# Patient Record
Sex: Female | Born: 1950 | Race: Black or African American | Hispanic: No | Smoking: Never smoker
Health system: Southern US, Community
[De-identification: ages and names within clinical notes are randomized; demographics above are authoritative.]

---

## 2020-11-16 ENCOUNTER — Other Ambulatory Visit: Payer: Self-pay

## 2020-11-16 ENCOUNTER — Inpatient Hospital Stay (HOSPITAL_COMMUNITY)
Admission: EM | Admit: 2020-11-16 | Discharge: 2020-11-19 | DRG: 310 | Disposition: A | Discharge status: Home Health | Payer: Medicare HMO | Attending: Family Medicine | Admitting: Family Medicine

## 2020-11-16 ENCOUNTER — Emergency Department (HOSPITAL_COMMUNITY): Payer: Medicare HMO

## 2020-11-16 DIAGNOSIS — N1832 Chronic kidney disease, stage 3b: Secondary | ICD-10-CM | POA: Diagnosis present

## 2020-11-16 DIAGNOSIS — E871 Hypo-osmolality and hyponatremia: Secondary | ICD-10-CM | POA: Diagnosis present

## 2020-11-16 DIAGNOSIS — I428 Other cardiomyopathies: Secondary | ICD-10-CM | POA: Diagnosis present

## 2020-11-16 DIAGNOSIS — E559 Vitamin D deficiency, unspecified: Secondary | ICD-10-CM | POA: Diagnosis present

## 2020-11-16 DIAGNOSIS — I959 Hypotension, unspecified: Secondary | ICD-10-CM | POA: Diagnosis present

## 2020-11-16 DIAGNOSIS — Z20822 Contact with and (suspected) exposure to covid-19: Secondary | ICD-10-CM | POA: Diagnosis present

## 2020-11-16 DIAGNOSIS — I517 Cardiomegaly: Secondary | ICD-10-CM | POA: Diagnosis present

## 2020-11-16 DIAGNOSIS — E875 Hyperkalemia: Secondary | ICD-10-CM | POA: Diagnosis not present

## 2020-11-16 DIAGNOSIS — Z7901 Long term (current) use of anticoagulants: Secondary | ICD-10-CM | POA: Diagnosis not present

## 2020-11-16 DIAGNOSIS — I4891 Unspecified atrial fibrillation: Secondary | ICD-10-CM | POA: Diagnosis not present

## 2020-11-16 DIAGNOSIS — I129 Hypertensive chronic kidney disease with stage 1 through stage 4 chronic kidney disease, or unspecified chronic kidney disease: Secondary | ICD-10-CM | POA: Diagnosis present

## 2020-11-16 DIAGNOSIS — N289 Disorder of kidney and ureter, unspecified: Secondary | ICD-10-CM

## 2020-11-16 DIAGNOSIS — Z79899 Other long term (current) drug therapy: Secondary | ICD-10-CM | POA: Diagnosis not present

## 2020-11-16 DIAGNOSIS — F32A Depression, unspecified: Secondary | ICD-10-CM | POA: Diagnosis present

## 2020-11-16 DIAGNOSIS — F0390 Unspecified dementia without behavioral disturbance: Secondary | ICD-10-CM | POA: Diagnosis present

## 2020-11-16 DIAGNOSIS — Z9119 Patient's noncompliance with other medical treatment and regimen: Secondary | ICD-10-CM | POA: Diagnosis not present

## 2020-11-16 DIAGNOSIS — I4821 Permanent atrial fibrillation: Principal | ICD-10-CM | POA: Diagnosis present

## 2020-11-16 LAB — BASIC METABOLIC PANEL WITH GFR
Anion gap: 10 (ref 5–15)
BUN: 24 mg/dL — ABNORMAL HIGH (ref 8–23)
CO2: 19 mmol/L — ABNORMAL LOW (ref 22–32)
Calcium: 9.2 mg/dL (ref 8.9–10.3)
Chloride: 105 mmol/L (ref 98–111)
Creatinine, Ser: 1.81 mg/dL — ABNORMAL HIGH (ref 0.44–1.00)
GFR, Estimated: 30 mL/min — ABNORMAL LOW
Glucose, Bld: 107 mg/dL — ABNORMAL HIGH (ref 70–99)
Potassium: 5.7 mmol/L — ABNORMAL HIGH (ref 3.5–5.1)
Sodium: 134 mmol/L — ABNORMAL LOW (ref 135–145)

## 2020-11-16 LAB — CBC
HCT: 38 % (ref 36.0–46.0)
Hemoglobin: 12.7 g/dL (ref 12.0–15.0)
MCH: 38.6 pg — ABNORMAL HIGH (ref 26.0–34.0)
MCHC: 33.4 g/dL (ref 30.0–36.0)
MCV: 115.5 fL — ABNORMAL HIGH (ref 80.0–100.0)
Platelets: 282 10*3/uL (ref 150–400)
RBC: 3.29 MIL/uL — ABNORMAL LOW (ref 3.87–5.11)
RDW: 20.6 % — ABNORMAL HIGH (ref 11.5–15.5)
WBC: 6.1 10*3/uL (ref 4.0–10.5)
nRBC: 1.7 % — ABNORMAL HIGH (ref 0.0–0.2)

## 2020-11-16 LAB — PROTIME-INR
INR: 1.8 — ABNORMAL HIGH (ref 0.8–1.2)
Prothrombin Time: 21.2 seconds — ABNORMAL HIGH (ref 11.4–15.2)

## 2020-11-16 LAB — MAGNESIUM: Magnesium: 2 mg/dL (ref 1.7–2.4)

## 2020-11-16 LAB — TSH: TSH: 4.224 u[IU]/mL (ref 0.350–4.500)

## 2020-11-16 MED ORDER — DILTIAZEM LOAD VIA INFUSION
10.0000 mg | Freq: Once | INTRAVENOUS | Status: AC
  Administered 2020-11-16: 10 mg via INTRAVENOUS
  Filled 2020-11-16: qty 10

## 2020-11-16 MED ORDER — SODIUM ZIRCONIUM CYCLOSILICATE 5 G PO PACK
5.0000 g | PACK | ORAL | Status: AC
  Administered 2020-11-16: 5 g via ORAL
  Filled 2020-11-16: qty 1

## 2020-11-16 MED ORDER — DRONABINOL 2.5 MG PO CAPS
2.5000 mg | ORAL_CAPSULE | Freq: Two times a day (BID) | ORAL | Status: DC
  Administered 2020-11-17 – 2020-11-19 (×4): 2.5 mg via ORAL
  Filled 2020-11-16 (×5): qty 1

## 2020-11-16 MED ORDER — DULOXETINE HCL 30 MG PO CPEP
30.0000 mg | ORAL_CAPSULE | Freq: Every day | ORAL | Status: DC
  Administered 2020-11-17 – 2020-11-18 (×3): 30 mg via ORAL
  Filled 2020-11-16 (×4): qty 1

## 2020-11-16 MED ORDER — VITAMIN D 25 MCG (1000 UNIT) PO TABS
2000.0000 [IU] | ORAL_TABLET | Freq: Every day | ORAL | Status: DC
  Administered 2020-11-17 – 2020-11-19 (×3): 2000 [IU] via ORAL
  Filled 2020-11-16 (×3): qty 2

## 2020-11-16 MED ORDER — HEPARIN (PORCINE) 25000 UT/250ML-% IV SOLN
1200.0000 [IU]/h | INTRAVENOUS | Status: DC
  Administered 2020-11-16: 1200 [IU]/h via INTRAVENOUS
  Filled 2020-11-16: qty 250

## 2020-11-16 MED ORDER — GABAPENTIN 100 MG PO CAPS
100.0000 mg | ORAL_CAPSULE | Freq: Every day | ORAL | Status: DC
  Administered 2020-11-17 – 2020-11-18 (×3): 100 mg via ORAL
  Filled 2020-11-16 (×3): qty 1

## 2020-11-16 MED ORDER — DILTIAZEM HCL-DEXTROSE 125-5 MG/125ML-% IV SOLN (PREMIX)
5.0000 mg/h | INTRAVENOUS | Status: DC
  Administered 2020-11-16 – 2020-11-17 (×2): 5 mg/h via INTRAVENOUS
  Filled 2020-11-16 (×2): qty 125

## 2020-11-16 NOTE — ED Provider Notes (Signed)
Is a 70 year old female presenting with atrial fibrillation with rapid ventricular rate.  The patient is not very clear on her history and is not able to give me a very clear understanding of why she is even here.  According to the notes from the office she was sent for ongoing atrial fibrillation with rapid ventricular rate.  The patient has been treated with anticoagulants and rate control however the patient is very clear in telling us that she does not take her medications regularly and when I asked when she last missed her dose of anticoagulant she states she misses it frequently.  She feels the need to repeatedly tell me that she does not do dope  On exam she is obese, she has a normal respiratory rate it is able to speak in full sentences but has a significant tachycardia with a heart rate of between 150 and 170 bpm and what appears to be a narrow complex irregular tachycardia.  This patient is critically ill with atrial fibrillation and rapid ventricular response, she will need IV Cardizem with a drip and admission to the hospital.  She does not meet the candidate criteria for cardioversion since she is not compliant with her medications and with her altered mental status we cannot be clear about the truth behind her compliance.  There is no family members with her and there is no medical records to review as this is her first time in the system.  Will consult with admitting team, provide rate control and provide IV heparin.  The son has now arrived and states that in February he was contacted by the patient to come down to [facility] where she had been admitted with sepsis, she has chronic atrial fibrillation and after the sepsis was treated he brought the patient back to [facility] to live with him because he was unsure if she was taking her medication, since then he has been taking care of her daily and making sure that she is compliant with her medications.  Unfortunately she was readmitted with  atrial fibrillation with a rapid rate approximately 3 weeks ago at [facility], she was reinitiated on the medications that have been discontinued due to hypotension such as metoprolol, since that time she has done okay but when she went back to the office for a visit today he was instructed to bring her back to the hospital because of atrial fibrillation with a rapid rate, borderline hypotension and the need to see electrophysiology and have other considerations such as ablation or antiarrhythmics.  He has been giving her Eliquis consistently. - no missed doses.  I discussed the care with cardiology, they agree that EP can be consulted in the morning, discussed with family medicine who will admit, patient is critically ill but doing better on Cardizem drip  Medical screening examination/treatment/procedure(s) were conducted as a shared visit with non-physician practitioner(s) and myself.  I personally evaluated the patient during the encounter.  Clinical Impression:   Final diagnoses:  Atrial fibrillation with tachycardic ventricular rate (HCC)  Hyperkalemia  Renal insufficiency    .Critical Care Performed by: Eber Hong, MD Authorized by: Eber Hong, MD   Critical care provider statement:    Critical care time (minutes):  35   Critical care time was exclusive of:  Separately billable procedures and treating other patients and teaching time   Critical care was necessary to treat or prevent imminent or life-threatening deterioration of the following conditions:  Cardiac failure   Critical care was time  spent personally by me on the following activities:  Blood draw for specimens, development of treatment plan with patient or surrogate, discussions with consultants, evaluation of patient's response to treatment, examination of patient, obtaining history from patient or surrogate, ordering and performing treatments and interventions, ordering and review of laboratory studies,  ordering and review of radiographic studies, pulse oximetry, re-evaluation of patient's condition and review of old charts      Eber Hong, MD 11/16/20 2125

## 2020-11-16 NOTE — H&P (Incomplete)
Family Medicine Teaching [facility] Admission History and Physical Service Pager: [phone]  Patient name: Kylie Collins Medical record number: [id number] Date of birth: 03/09/1951 Age: 70 y.o. Gender: female  Primary Care Provider: Pcp, No Consultants: Cardiology Code Status: Full Preferred Emergency Contact: Kylie Collins (son) [phone]  Chief Complaint: atrial fibrillation with RVR  Assessment and Plan: Kylie Collins is a 70 y.o. female presenting with *** . PMH is significant for ***  ***  Atrial fibrillation with RVR CHADSVASc HASBLED*** -Admit to FPTS, attending Dr. Deirdre Priest -cardiology consulted, appreciate recommendations -am EKG -  Hx of Hypertension Hypotension  Hyponatremia  Hyperkalemia  AKI? in the setting of CKD IIIb Cr 1.81 and GFR 30 on admission, unable to determine baseline given no records. -am CMP -avoid nephrotoxic agents    Memory concerns -delirium precautions  Depression  Vitamin D deficiency Home meds include cholecalciferol 2,000 units daily.  -continue home supplementation   FEN/GI: heart healthy  Prophylaxis: heparin   Disposition: admit to FPTS (cardiac telemetry), attending Dr. Deirdre Priest   History of Present Illness:  Kylie Collins is a 70 y.o. female presenting with A fib with RVR. Patient was in her usual state of health and went to cardiologist's office where she was found to be in in A fib with RVR, HR in 150s. Patient recently moved from [place] into home with her son in February as she has dementia and isn't able to live by herself. She does none of her IADLs, she is able to do ADLs **. Patient ambulates with walker at baseline, but needs full assistance with transfers. Patient was seen on 4/7  History predominantly obtained from son.  Review Of Systems: Per HPI with the following additions:   Review of Systems  Constitutional: Negative for activity change, chills and fever.  Cardiovascular: Positive for  palpitations. Negative for chest pain.  Psychiatric/Behavioral: Positive for confusion.  All other systems reviewed and are negative.    Patient Active Problem List   Diagnosis Date Noted  . Atrial fibrillation with rapid ventricular response (HCC) 11/16/2020    Past Medical History: No past medical history on file.  Past Surgical History: *** The histories are not reviewed yet. Please review them in the "History" navigator section and refresh this SmartLink.  Denies h/o surgery  Social History:   Additional social history: patient lives with her son and his family, moved to [place] from [place] in February, 2022.  Please also refer to relevant sections of EMR.  Family History: No family history on file.  Allergies and Medications: No Known Allergies No current facility-administered medications on file prior to encounter.   No current outpatient medications on file prior to encounter.    Objective: BP 102/90   Pulse 85   Temp 97.6 F (36.4 C) (Axillary)   Resp 16   SpO2 96%  Exam: General: *** Eyes: *** ENTM: *** Neck: *** Cardiovascular: *** Respiratory: *** Gastrointestinal: *** MSK: *** Derm: *** Neuro: *** Psych: ***  Labs and Imaging: CBC BMET  Recent Labs  Lab 11/16/20 1709  WBC 6.1  HGB 12.7  HCT 38.0  PLT 282   Recent Labs  Lab 11/16/20 1709  NA 134*  K 5.7*  CL 105  CO2 19*  BUN 24*  CREATININE 1.81*  GLUCOSE 107*  CALCIUM 9.2     EKG: My own interpretation (not copied from electronic read) ***   DG Chest 2 View  Result Date: 11/16/2020 CLINICAL DATA:  Atrial fibrillation. EXAM: CHEST - 2 VIEW  COMPARISON:  None. FINDINGS: Mild cardiomegaly. Normal mediastinal contours. No pulmonary edema. No focal airspace disease, large pleural effusion or pneumothorax. Multilevel degenerative change in the thoracic spine. Superior subluxation of the humeral head abutting the undersurface of the acromion on the right. No acute osseous abnormalities  are seen. IMPRESSION: Mild cardiomegaly without acute pulmonary process. Electronically Signed   By: Narda Rutherford M.D.   On: 11/16/2020 17:56    Reece Leader, DO 11/16/2020, 9:25 PM PGY-1, [facility] Family Medicine FPTS Intern pager: [phone], text pages welcome

## 2020-11-16 NOTE — ED Triage Notes (Signed)
Pt bib [facility] from [facility] cardiologist office. Pt in afib w/RVR at office, EMS was called. Pt's HR =150s-200s.  Pt states she feels fine, denies pain, weakness or shortness of breath on arrival.   EMS VS: BP=100/62 HR=150s-200s

## 2020-11-16 NOTE — ED Provider Notes (Signed)
[facility] EMERGENCY DEPARTMENT Provider Note   CSN: [id number] Arrival date & time: 11/16/20  1651     History Chief Complaint  Patient presents with  . afib w/RVR    Kylie Collins is a 70 y.o. female.  HPI   Patient presents via EMS to ED with AF with RVR from the office of Dr. Houston Siren. Patient has a history of persistent atrial fibrillation. She was hospitalized two weeks ago for this condition, but patient is unable to recall any medication changes or treatment plans. She has been taking elliquis for one year, but reports medical noncompliance. She states she has missed "more than one dose" in the past. Her son states that she has been living with him for the past two months and he has been distributing her medications. She has been off and on her metoprolol due to sporadic hypotension.   She reports feeling weak, but denies any chest pain, SOB, leg welling, or syncope.   No past medical history on file.  There are no problems to display for this patient.     OB History   No obstetric history on file.     No family history on file.     Home Medications Prior to Admission medications   Not on File    Allergies    Patient has no known allergies.  Review of Systems    All other systems were reviewed and are negative except for those noted in the HPI.  Physical Exam Updated Vital Signs BP 107/84   Pulse 98   Temp 97.6 F (36.4 C) (Axillary)   Resp 17   SpO2 95%   Physical Exam Vitals and nursing note reviewed. Exam conducted with a chaperone present.  Constitutional:      Appearance: Normal appearance.  HENT:     Head: Normocephalic and atraumatic.  Eyes:     General: No scleral icterus.       Right eye: No discharge.        Left eye: No discharge.     Extraocular Movements: Extraocular movements intact.     Pupils: Pupils are equal, round, and reactive to light.  Cardiovascular:     Rate and Rhythm: Tachycardia present.  Rhythm irregular.     Pulses: Normal pulses.     Heart sounds: Normal heart sounds. No murmur heard. No friction rub. No gallop.   Pulmonary:     Effort: Pulmonary effort is normal. No respiratory distress.     Breath sounds: Normal breath sounds.  Abdominal:     General: Abdomen is flat. Bowel sounds are normal. There is no distension.     Palpations: Abdomen is soft.     Tenderness: There is no abdominal tenderness.  Skin:    General: Skin is warm and dry.     Coloration: Skin is not jaundiced.  Neurological:     Mental Status: She is alert. Mental status is at baseline.     Coordination: Coordination normal.     ED Results / Procedures / Treatments   Labs (all labs ordered are listed, but only abnormal results are displayed) Labs Reviewed  BASIC METABOLIC PANEL - Abnormal; Notable for the following components:      Result Value   Sodium 134 (*)    Potassium 5.7 (*)    CO2 19 (*)    Glucose, Bld 107 (*)    BUN 24 (*)    Creatinine, Ser 1.81 (*)    GFR, Estimated 30 (*)  All other components within normal limits  CBC - Abnormal; Notable for the following components:   RBC 3.29 (*)    MCV 115.5 (*)    MCH 38.6 (*)    RDW 20.6 (*)    nRBC 1.7 (*)    All other components within normal limits  PROTIME-INR - Abnormal; Notable for the following components:   Prothrombin Time 21.2 (*)    INR 1.8 (*)    All other components within normal limits  SARS CORONAVIRUS 2 (TAT 6-24 HRS)  MAGNESIUM  TSH  RAPID URINE DRUG SCREEN, HOSP PERFORMED  HEPARIN LEVEL (UNFRACTIONATED)  APTT    EKG EKG Interpretation  Date/Time:  Wednesday November 16 2020 16:52:34 EDT Ventricular Rate:  166 PR Interval:    QRS Duration: 86 QT Interval:  256 QTC Calculation: 426 R Axis:   -70 Text Interpretation: Atrial fibrillation with rapid V-rate Left anterior fascicular block Low voltage, precordial leads Repolarization abnormality, prob rate related no old EKG Confirmed by Eber Hong  [phone]) on 11/16/2020 5:31:59 PM   Radiology DG Chest 2 View  Result Date: 11/16/2020 CLINICAL DATA:  Atrial fibrillation. EXAM: CHEST - 2 VIEW COMPARISON:  None. FINDINGS: Mild cardiomegaly. Normal mediastinal contours. No pulmonary edema. No focal airspace disease, large pleural effusion or pneumothorax. Multilevel degenerative change in the thoracic spine. Superior subluxation of the humeral head abutting the undersurface of the acromion on the right. No acute osseous abnormalities are seen. IMPRESSION: Mild cardiomegaly without acute pulmonary process. Electronically Signed   By: Narda Rutherford M.D.   On: 11/16/2020 17:56    Procedures Procedures   Medications Ordered in ED Medications  diltiazem (CARDIZEM) 1 mg/mL load via infusion 10 mg (10 mg Intravenous Bolus from Bag 11/16/20 1810)    And  diltiazem (CARDIZEM) 125 mg in dextrose 5% 125 mL (1 mg/mL) infusion (5 mg/hr Intravenous New Bag/Given 11/16/20 1805)  heparin ADULT infusion 100 units/mL (25000 units/28mL) (1,200 Units/hr Intravenous New Bag/Given 11/16/20 1833)  sodium zirconium cyclosilicate (LOKELMA) packet 5 g (has no administration in time range)    ED Course  I have reviewed the triage vital signs and the nursing notes.  Pertinent labs & imaging results that were available during my care of the patient were reviewed by me and considered in my medical decision making (see chart for details).    MDM Rules/Calculators/A&P                          Patient is a 70 year old patient brought to ED by EMS for AF with AVR. HR tachycardic to 160s. Not hypoxic. Given heparin and Cardizem. Consulted cardiology and decided to admit to medicine with EP consult tomorrow.   Final Clinical Impression(s) / ED Diagnoses Final diagnoses:  None    Rx / DC Orders ED Discharge Orders         Ordered    Amb referral to AFIB Clinic        11/16/20 1709           Theron Arista, Cordelia Poche 11/16/20 1956    Eber Hong,  MD 11/26/20 [phone]

## 2020-11-16 NOTE — Progress Notes (Signed)
ANTICOAGULATION CONSULT NOTE - Initial Consult  Pharmacy Consult for heparin dosing. Indication: atrial fibrillation  No Known Allergies  Patient Measurements:   Heparin Dosing Weight: (unknown, seen note below)   Vital Signs: Temp: 97.6 F (36.4 C) (04/27 1652) Temp Source: Axillary (04/27 1652) BP: 110/79 (04/27 1652) Pulse Rate: 37 (04/27 1652)  Labs: No results for input(s): HGB, HCT, PLT, APTT, LABPROT, INR, HEPARINUNFRC, HEPRLOWMOCWT, CREATININE, CKTOTAL, CKMB, TROPONINIHS in the last 72 hours.  CrCl cannot be calculated (No successful lab value found.).   Medical History: No past medical history on file.   Assessment: 70 y.o. female presenting with Afib in RVR. Pharmacy has been consulted IV heparin for afib. Patient reportedly on Eliquis PTA for afib but has been noted as non-compliant with her medications. Her current AMS, lack of EMR charts to review, and no family present limits ability to clarify if she has taken her Eliquis recently. Attempted to call family at number of file but is apparently not connected to the patient or relative. Patient states she is unable to walk, and when asked about her weight stated "100-something...200-something". MD notes suggestive that patient most likely has not taken Eliquis it today but will avoid heparin bolus and initiate infusion at a lower rate given uncertainty.   Goal of Therapy:  Heparin level 0.3-0.7 units/ml Monitor platelets by anticoagulation protocol: Yes   Plan:  Start heparin infusion at 1200 units/hr Check anti-Xa level in 6 hours and daily while on heparin Check baseline aPTT due to possible Eliquis use Continue to monitor H&H and platelets  Trixie Rude, PharmD PGY1 Acute Care Pharmacy Resident 11/16/2020 5:57 PM  Please check [email or web address] for unit-specific pharmacy phone numbers.

## 2020-11-16 NOTE — H&P (Addendum)
Family Medicine Teaching [facility] Admission History and Physical Service Pager: [phone]  Patient name: Kylie Collins Medical record number: [id number] Date of birth: [birth year] Age: 70 y.o. Gender: female  Primary Care Provider: Pcp, No Consultants: Cardiology Code Status: Full Preferred Emergency Contact: Leah Thornberry (son) [phone]  Chief Complaint: atrial fibrillation with RVR  Assessment and Plan: Kylie Collins is a 70 y.o. female presenting with atrial fibrillation with RVR. PMH is significant for atrial fibrillation, CKD, depression and vitamin D deficiency.   Atrial fibrillation with RVR Patient presents after having regular outpatient cardiology follow up earlier today where she was noted to be in RVR in 150-160 range with known history of atrial fibrillation. She presented tachycardic to 160s and tachypneic to 30s while normotensive. Remained asymptomatic as she did not experience chest pain, dyspnea and LE edema. She has not required supplemental oxygen. Home medications include metoprolol tartrate 25 mg daily and eliquis 5 mg bid. No echo per chart review as patient recently moved from [place] to live with her son. Labs notable for Na 134, K 5.7, Cr 1.81 and TSH wnl. CXR demonstrated mild cardiomegaly without acute pulmonary process. In the ED, given diltiazem bolus and placed on diltiazem drip, HR improved to 100-110 range. On physical exam, patient no longer tachycardic but noted to have irregularly irregular rhythm consistent with atrial fibrillation. Appears euvolemic on exam. CHADSVASC score of 3 and HASBLED score of 1. Will admit for management of rate control, cardiology consulted. Cardiology recommends EP assessment tomorrow. -Admit to FPTS, attending Dr. Deirdre Priest -cardiology consulted, appreciate recommendations -hold home meds  -s/p diltiazem bolus  - continue IV diltiazem gtt -heparin per pharmacy  -am EKG -f/u am CBC, CMP, Mg, Phos -awaiting  echo -monitor HR -heart healthy diet  -up with assistance -PT/OT eval and treat -cardiology outpatient follow up following discharge  -consult EP tomorrow  Hypotension  Hx of Hypertension  BP on admission 110/79. Concerns of hypotension per son although patient noted to have history of hypertension. Home meds include metoprolol tartrate for atrial fibrillation as above but not taking any anti-hypertensives. -monitor BP -awaiting orthostatic vitals   Electrolyte derangements Admitted with hyperkalemia of 5.7 and mild hyponatremia of 134.  - hold home potassium replacement -s/p lokelma 5 g -am CMP  AKI? in the setting of CKD IIIb Cr 1.81 and GFR 30 on admission, unable to determine baseline given no records. -am CMP to monitor renal function -avoid nephrotoxic agents    Memory concerns Patient is currently AOx1, son reports that this is baseline as she waxes and wanes often. Given that patient's son has just recently taken on role of caregiver, he is unaware of if patient has a documented diagnosis of dementia although this is likely given the progressive state, memory impairment, ADL and IADL impairment, and executive dysfunction in >2 domains. -delirium precautions -consider neurology outpatient for formal evaluation   Depression Home meds include duloxetine 30 mg daily. -continue home duloxetine   Vitamin D deficiency Home meds include cholecalciferol 2,000 units daily.  -continue home supplementation   FEN/GI: heart healthy  Prophylaxis: heparin   Disposition: admit to FPTS (cardiac telemetry), attending Dr. Deirdre Priest   History of Present Illness:  Kylie Collins is a 70 y.o. female presenting with A fib with RVR. Patient was in her usual state of health and went to cardiologist's office where she was found to be in in A fib with RVR, HR in 150s. Son reports that patient was and has remained asymptomatic. Patient recently  moved from [place] into home with her son in  February as she has dementia and isn't able to live by herself. She does none of her IADLs, she is able to feed herself. Patient utilizes wheelchair assistance over the pat 7-8 months but ambulated with walker prior to this.  Patient requires full assistance for transfers. She was previously seen at [facility] in [place] for same concern. Home meds include metoprolol which she has been intermittently on and off of it given concerns for hypotension along with eliquis. Patient has no history of prior strokes, per son.   History predominantly obtained from son.  Review Of Systems: Per HPI with the following additions:   Review of Systems  Constitutional: Negative for activity change, chills and fever.  Respiratory: Negative for cough and shortness of breath.   Cardiovascular: Negative for chest pain, palpitations and leg swelling.  Gastrointestinal: Negative for nausea and vomiting.  Neurological: Negative for headaches.  Psychiatric/Behavioral: Positive for confusion.  All other systems reviewed and are negative.    Patient Active Problem List   Diagnosis Date Noted  . Atrial fibrillation with rapid ventricular response (HCC) 11/16/2020    Past Medical History: No past medical history on file.  Past Surgical History: Denies past surgical history.   Social History:   Additional social history: patient lives with her son and his family, moved to [place] from [place] in February, 2022.  Please also refer to relevant sections of EMR.  Family History: No family history on file.  Allergies and Medications: No Known Allergies No current facility-administered medications on file prior to encounter.   No current outpatient medications on file prior to encounter.    Objective: BP 102/90   Pulse 85   Temp 97.6 F (36.4 C) (Axillary)   Resp 16   SpO2 96%  Exam: General: age appropriate AAW, Patient sitting upright in bed, in no acute distress, obese Neck: supple, no evidence of  lymphadenopathy, non-tender thyroid  Cardiovascular: irregularly irregular rhythm, no murmurs or gallops auscultated  Respiratory: CTAB, no rales or rhonchi noted Gastrointestinal: soft, nontender, BS+ Ext: radial and distal pulses strong and equal bilaterally, no LE edema noted bilaterally, hyperpigmentation mid tiba to ankle appearing chronic, no calf tenderness noted bilaterally  Derm: skin warm and dry to touch, no rashes or lesions noted  Neuro: AOx1 (thinks we are in [place] and its 1997), follows all commands Psych: mood appropriate, pleasant, no agitation noted   Labs and Imaging: CBC BMET  Recent Labs  Lab 11/16/20 1709  WBC 6.1  HGB 12.7  HCT 38.0  PLT 282   Recent Labs  Lab 11/16/20 1709  NA 134*  K 5.7*  CL 105  CO2 19*  BUN 24*  CREATININE 1.81*  GLUCOSE 107*  CALCIUM 9.2     EKG: HR 100, indiscernible p waves consistent with atrial fibrillation, no notable ST elevations, peaked T waves     DG Chest 2 View  Result Date: 11/16/2020 CLINICAL DATA:  Atrial fibrillation. EXAM: CHEST - 2 VIEW COMPARISON:  None. FINDINGS: Mild cardiomegaly. Normal mediastinal contours. No pulmonary edema. No focal airspace disease, large pleural effusion or pneumothorax. Multilevel degenerative change in the thoracic spine. Superior subluxation of the humeral head abutting the undersurface of the acromion on the right. No acute osseous abnormalities are seen. IMPRESSION: Mild cardiomegaly without acute pulmonary process. Electronically Signed   By: Narda Rutherford M.D.   On: 11/16/2020 17:56    Reece Leader, DO 11/16/2020, 9:25 PM PGY-1, [facility] FPTS Intern pager: [phone], text pages welcome  FPTS Upper-Level Resident Addendum   I have independently interviewed and examined the patient. I have discussed the above with the original author and agree with their documentation. My edits for correction/addition/clarification are in pink. Please see also any attending  notes.   Shirlean Mylar, M.D. PGY-2, [facility] 11/17/2020 4:58 AM  FPTS Service pager: [phone] (text pages welcome through [facility])

## 2020-11-17 ENCOUNTER — Encounter (HOSPITAL_COMMUNITY): Payer: Self-pay | Admitting: Family Medicine

## 2020-11-17 ENCOUNTER — Other Ambulatory Visit: Payer: Self-pay

## 2020-11-17 ENCOUNTER — Other Ambulatory Visit (HOSPITAL_COMMUNITY): Payer: Medicare HMO

## 2020-11-17 DIAGNOSIS — I4891 Unspecified atrial fibrillation: Secondary | ICD-10-CM

## 2020-11-17 LAB — CBC
HCT: 36.3 % (ref 36.0–46.0)
Hemoglobin: 12.1 g/dL (ref 12.0–15.0)
MCH: 38.4 pg — ABNORMAL HIGH (ref 26.0–34.0)
MCHC: 33.3 g/dL (ref 30.0–36.0)
MCV: 115.2 fL — ABNORMAL HIGH (ref 80.0–100.0)
Platelets: 260 10*3/uL (ref 150–400)
RBC: 3.15 MIL/uL — ABNORMAL LOW (ref 3.87–5.11)
RDW: 20.5 % — ABNORMAL HIGH (ref 11.5–15.5)
WBC: 5.6 10*3/uL (ref 4.0–10.5)
nRBC: 1.1 % — ABNORMAL HIGH (ref 0.0–0.2)

## 2020-11-17 LAB — HEMOGLOBIN A1C
Hgb A1c MFr Bld: 4.6 % — ABNORMAL LOW (ref 4.8–5.6)
Mean Plasma Glucose: 85.32 mg/dL

## 2020-11-17 LAB — HEPARIN LEVEL (UNFRACTIONATED)
Heparin Unfractionated: >1.1 IU/mL — ABNORMAL HIGH (ref 0.30–0.70)
Heparin Unfractionated: >1.1 IU/mL — ABNORMAL HIGH (ref 0.30–0.70)
Heparin Unfractionated: >1.1 IU/mL — ABNORMAL HIGH (ref 0.30–0.70)

## 2020-11-17 LAB — COMPREHENSIVE METABOLIC PANEL
ALT: 15 U/L (ref 0–44)
AST: 17 U/L (ref 15–41)
Albumin: 2.7 g/dL — ABNORMAL LOW (ref 3.5–5.0)
Alkaline Phosphatase: 90 U/L (ref 38–126)
Anion gap: 11 (ref 5–15)
BUN: 24 mg/dL — ABNORMAL HIGH (ref 8–23)
CO2: 18 mmol/L — ABNORMAL LOW (ref 22–32)
Calcium: 9.4 mg/dL (ref 8.9–10.3)
Chloride: 104 mmol/L (ref 98–111)
Creatinine, Ser: 1.97 mg/dL — ABNORMAL HIGH (ref 0.44–1.00)
GFR, Estimated: 27 mL/min — ABNORMAL LOW (ref >60)
Glucose, Bld: 83 mg/dL (ref 70–99)
Potassium: 5.5 mmol/L — ABNORMAL HIGH (ref 3.5–5.1)
Sodium: 133 mmol/L — ABNORMAL LOW (ref 135–145)
Total Bilirubin: 1.2 mg/dL (ref 0.3–1.2)
Total Protein: 6.1 g/dL — ABNORMAL LOW (ref 6.5–8.1)

## 2020-11-17 LAB — BASIC METABOLIC PANEL
Anion gap: 9 (ref 5–15)
BUN: 25 mg/dL — ABNORMAL HIGH (ref 8–23)
CO2: 20 mmol/L — ABNORMAL LOW (ref 22–32)
Calcium: 9.1 mg/dL (ref 8.9–10.3)
Chloride: 104 mmol/L (ref 98–111)
Creatinine, Ser: 1.97 mg/dL — ABNORMAL HIGH (ref 0.44–1.00)
GFR, Estimated: 27 mL/min — ABNORMAL LOW (ref >60)
Glucose, Bld: 86 mg/dL (ref 70–99)
Potassium: 4.9 mmol/L (ref 3.5–5.1)
Sodium: 133 mmol/L — ABNORMAL LOW (ref 135–145)

## 2020-11-17 LAB — APTT
aPTT: >200 seconds (ref 24–36)
aPTT: >200 seconds (ref 24–36)
aPTT: >200 seconds (ref 24–36)

## 2020-11-17 LAB — SARS CORONAVIRUS 2 (TAT 6-24 HRS): SARS Coronavirus 2: NEGATIVE

## 2020-11-17 LAB — PHOSPHORUS: Phosphorus: 2.9 mg/dL (ref 2.5–4.6)

## 2020-11-17 LAB — MAGNESIUM: Magnesium: 1.9 mg/dL (ref 1.7–2.4)

## 2020-11-17 MED ORDER — DILTIAZEM HCL ER COATED BEADS 180 MG PO CP24
180.0000 mg | ORAL_CAPSULE | Freq: Every day | ORAL | Status: DC
  Administered 2020-11-17: 180 mg via ORAL
  Filled 2020-11-17 (×2): qty 1

## 2020-11-17 MED ORDER — SODIUM ZIRCONIUM CYCLOSILICATE 5 G PO PACK
5.0000 g | PACK | ORAL | Status: AC
  Administered 2020-11-17: 5 g via ORAL
  Filled 2020-11-17: qty 1

## 2020-11-17 MED ORDER — HEPARIN (PORCINE) 25000 UT/250ML-% IV SOLN
850.0000 [IU]/h | INTRAVENOUS | Status: DC
  Filled 2020-11-17: qty 250

## 2020-11-17 MED ORDER — HEPARIN (PORCINE) 25000 UT/250ML-% IV SOLN
900.0000 [IU]/h | INTRAVENOUS | Status: DC
  Administered 2020-11-17: 900 [IU]/h via INTRAVENOUS

## 2020-11-17 NOTE — Progress Notes (Signed)
ANTICOAGULATION CONSULT NOTE - Follow Up Consult  Pharmacy Consult for heparin Indication: atrial fibrillation  No Known Allergies  Patient Measurements: Height: 5\' 7"  (170.2 cm) Weight: 113.4 kg (250 lb) IBW/kg (Calculated) : 61.6 Heparin Dosing Weight: 87.9 kg  Vital Signs: Temp: 97.5 F (36.4 C) (04/28 1347) Temp Source: Axillary (04/28 1347) BP: 112/76 (04/28 1347) Pulse Rate: 101 (04/28 1347)  Labs: Recent Labs    11/16/20 1709 11/17/20 0142 11/17/20 0143 11/17/20 0203 11/17/20 0428  HGB 12.7  --   --  12.1  --   HCT 38.0  --   --  36.3  --   PLT 282  --   --  260  --   APTT  --  >200*  --   --  >200*  LABPROT 21.2*  --   --   --   --   INR 1.8*  --   --   --   --   HEPARINUNFRC  --   --  >1.10*  --  >1.10*  CREATININE 1.81*  --   --  1.97*  --     Estimated Creatinine Clearance: 34 mL/min (A) (by C-G formula based on SCr of 1.97 mg/dL (H)).   Medications:  Scheduled:  . cholecalciferol  2,000 Units Oral Daily  . dronabinol  2.5 mg Oral BID WC  . DULoxetine  30 mg Oral QHS  . gabapentin  100 mg Oral QHS    Assessment: 70 yo female presenting with Afib in RVR.  Patient is on Eliquis PTA and her son reports compliance with medication- states she lives with him and he watches her take it twice daily.  Last PTA dose appears to be 4/27 AM.  Pharmacy consulted to dose IV heparin.  Awaiting EP rec's before transitioning back to PO anticoagulation.   aPTT consistently elevated at >200.  Confirmed with RN that level drawn from R arm, heparin running through L arm.  No bleeding reported.  CBC stable.  HL supratherapeutic at >1.1 due to recent Eliquis administration.  Continue to trend both HL and aPTT until they are correlating as likely still seeing the effects of Eliquis.    Goal of Therapy:  Heparin level 0.3-0.7 units/ml  APTT 66-102 seconds Monitor platelets by anticoagulation protocol: Yes   Plan:  Hold heparin infusion x 1 hour Restart heparin drip at  700 units/hr at 1800 (6pm) F/u HL and aPTT in 8 hours  Monitor for s/sx bleedng F/u ability to switch to Eliquis  5/27, PharmD PGY-1 Acute Care Pharmacy Resident Office: [phone] 11/17/2020 3:04 PM

## 2020-11-17 NOTE — Consult Note (Addendum)
Cardiology Consultation:   Patient ID: Kylie Collins MRN: [id number]; DOB: 04/20/1951  Admit date: 11/16/2020 Date of Consult: 11/17/2020  PCP:  Kylie Collins   [facility]  Cardiologist:  none [id number]    Patient Profile:   Kylie Collins is a 70 y.o. female with unknown PMHx, H&P reports AFib hx and HTN, Vit D deficiency, CKD, depression, and home meds of metoprolol tartrate 25 mg daily and eliquis 5 mg bid, this apparently obtained via the patient's son. who is being seen today for the evaluation of  at the request of Dr. Deirdre Collins.  History of Present Illness:   PMHx and HPI is obtained entirely from the chart The patient Kylie Collins open her eyes and follow commands though only after much prompting, tells me she is tired. Doesn't know why she is her or how she got here, initially told m she was at home H&P report AAOx1, this is the same now. Son apparently gave this as baseline as well that waxes and wanes.  There is no contact in her chart for her son  Kylie Collins in review was reportedly at a cardiology office found in RVR and apparently referred to the ER, admitted yesterday, started on dilt for rate control and heparin gtt,  I do not see any cardiology charting though apparently recommendation was to consult EP in the morning  Rates are better on the diltiazem gtt though to the low 100's only on 5mg /hr gtt  LABS K+ 5.7 > 5.5 BUN/Creat 24/1.97 Mag 2.0, 1.9 WBC 5.6 H/H 12/36 Plts 260  echo ordered, not yet completed      Inpatient Medications: Scheduled Meds:  cholecalciferol  2,000 Units Oral Daily   dronabinol  2.5 mg Oral BID WC   DULoxetine  30 mg Oral QHS   gabapentin  100 mg Oral QHS   Continuous Infusions:  diltiazem (CARDIZEM) infusion 5 mg/hr (11/17/20 1339)   heparin 900 Units/hr (11/17/20 0653)   PRN Meds:   Allergies:   No Known Allergies  Social History:   Social History   Socioeconomic History   Marital status:  Unknown    Spouse name: Not on file   Number of children: Not on file   Years of education: Not on file   Highest education level: Not on file  Occupational History   Not on file  Tobacco Use   Smoking status: Not on file   Smokeless tobacco: Not on file  Substance and Sexual Activity   Alcohol use: Not on file   Drug use: Not on file   Sexual activity: Not on file  Other Topics Concern   Not on file  Social History Narrative   Not on file   Social Determinants of Health   Financial Resource Strain: Not on file  Food Insecurity: Not on file  Transportation Needs: Not on file  Physical Activity: Not on file  Stress: Not on file  Social Connections: Not on file  Intimate Partner Violence: Not on file    Family History:   Unable to obtain, pt AAO x1 only, no family available  ROS:  Please see the history of present illness.  All other ROS reviewed and negative.     Physical Exam/Data:   Vitals:   11/17/20 1326 11/17/20 1346 11/17/20 1347 11/17/20 1512  BP:   112/76 107/77  Pulse:  98 (!) 101 96  Resp:  12 16 14   Temp:   (!) 97.5 F (36.4 C) (!) 96.7 F (35.9  C)  TempSrc:   Axillary Axillary  SpO2:  96% 96%   Weight: 113.4 kg     Height: 5\' 7"  (1.702 m)      No intake or output data in the 24 hours ending 11/17/20 1538 Last 3 Weights 11/17/2020  Weight (lbs) 250 lb  Weight (kg) 113.399 kg     Body mass index is 39.16 kg/m.  General:  Well nourished, well developed, in no acute distress, sleeping but wakes with verbal HEENT: normal Lymph: no adenopathy Neck: no JVD Endocrine:  No thryomegaly Vascular: No carotid bruits Cardiac:  irreg-irreg; no murmurs, gallops or rubs Lungs:  CA b/l, no wheezing, rhonchi or rales  Abd: soft, nontende  Ext: no edema Musculoskeletal:  No deformities Skin: warm and dry  Neuro:  AAO x1, follows direction with much prompting, no gross focal abnormalities noted Psych:  Normal affect   EKG:  The EKG was personally reviewed  and demonstrates:   AFib 166bpm Afib 101bpm, lat T inv  No historical EKGs  Telemetry:  Telemetry was personally reviewed and demonstrates:   AFib 100's-110s  Relevant CV Studies:  Echo is ordered and pending  Laboratory Data:  High Sensitivity Troponin:  No results for input(s): TROPONINIHS in the last 720 hours.   Chemistry Recent Labs  Lab 11/16/20 1709 11/17/20 0203  NA 134* 133*  K 5.7* 5.5*  CL 105 104  CO2 19* 18*  GLUCOSE 107* 83  BUN 24* 24*  CREATININE 1.81* 1.97*  CALCIUM 9.2 9.4  GFRNONAA 30* 27*  ANIONGAP 10 11    Recent Labs  Lab 11/17/20 0203  PROT 6.1*  ALBUMIN 2.7*  AST 17  ALT 15  ALKPHOS 90  BILITOT 1.2   Hematology Recent Labs  Lab 11/16/20 1709 11/17/20 0203  WBC 6.1 5.6  RBC 3.29* 3.15*  HGB 12.7 12.1  HCT 38.0 36.3  MCV 115.5* 115.2*  MCH 38.6* 38.4*  MCHC 33.4 33.3  RDW 20.6* 20.5*  PLT 282 260   BNPNo results for input(s): BNP, PROBNP in the last 168 hours.  DDimer No results for input(s): DDIMER in the last 168 hours.   Radiology/Studies:  DG Chest 2 View Result Date: 11/16/2020 CLINICAL DATA:  Atrial fibrillation. EXAM: CHEST - 2 VIEW COMPARISON:  None. FINDINGS: Mild cardiomegaly. Normal mediastinal contours. No pulmonary edema. No focal airspace disease, large pleural effusion or pneumothorax. Multilevel degenerative change in the thoracic spine. Superior subluxation of the humeral head abutting the undersurface of the acromion on the right. No acute osseous abnormalities are seen. IMPRESSION: Mild cardiomegaly without acute pulmonary process. Electronically Signed   By: 11/18/2020 M.D.   On: 11/16/2020 17:56     Assessment and Plan:   1. Afib     Verbally told by attending is permanent     By charted history her CHA2DS2Vasc is 3, on Eliquis outpt, heparin gtt here  Transition to PO dilt Echo is pending BP stable  H&P mentions son would like consideration for ablation, she is not a PVI ablation candidate  given mental status Rate control is most likely going to be her recommendation though awaiut her echo No number that I can fond for her son   2. Hyperkalemia     Attending team is addressing  3. AAO x1     Sounds like this may be her baseline     She is calm and cooperative in no distress     Deferred to attending    Risk Assessment/Risk  Scores:  { For questions or updates, please contact [facility] Please consult [email or web address] for contact info under    Signed, Sheilah Pigeon, PA-C  11/17/2020 3:38 PM  I have seen and examined this patient with Francis Dowse.  Agree with above, note added to reflect my findings.  On exam, tachycardic, regular.  Patient presented to the hospital last night and was found to be in atrial fibrillation.  Per report she has permanent atrial fibrillation.  She was quite tachycardic on presentation.  She is currently on a diltiazem IV infusion.  She is on low-dose.  We Raef Sprigg give her 180 mg of diltiazem today and stop the infusion to see if this Tashonda Pinkus control her heart rate.  This can be titrated up as her blood pressure tolerates.  Alyce Inscore M. Shareta Fishbaugh MD 11/17/2020 4:41 PM

## 2020-11-17 NOTE — Progress Notes (Signed)
Family Medicine Teaching Service Daily Progress Note Intern Pager: [phone]  Patient name: Kylie Collins Medical record number: [id number] Date of birth: November 02, 1950 Age: 70 y.o. Gender: female  Primary Care Provider: Pcp, No Consultants: Cardiology, EP Code Status: Full  Pt Overview and Major Events to Date:  4/27 Admitted  Assessment and Plan: Kylie Collins is a 70 y.o. female presenting with atrial fibrillation with RVR. PMH is significant for atrial fibrillation, CKD, depression and vitamin D deficiency.   Atrial fibrillation with RVR Patient with dementia, denies chest pain, SOB, discomfort of any kind. Patient tachycardic to 110s overnight. BP largely normotensive. Currently on diltiazem drip. AM EKG shows A.fib with rate 101. Normal mag and phos this morning. K 5.5 this AM s/p lokelma x1 overnight. Plan for ECHO later today. Appreciate cards recs. EP consulted.  - cardiology conuslted, appreciate ongoing care - EP consulted, appreciate recommendations - continue IV diltiazem gtt - heparin per pharmacy/cards - awaiting echo - re-dose lokelma, recheck 3pm BMP for K+  Hypotension  Hx of Hypertension  Largely normotensive overnight with systolics 100s-120s.  - monitor BP - awaiting orthostatic vitals   Electrolyte derangements K 5.7 on admission, given lokelma 5g. This morning reduced slightly to 5.5. Will redose lokelma this morning, recheck BMP at 3pm.  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  - consider IVF after assessing fluid status -am CMP to monitor renal function -avoid nephrotoxic agents    Memory concerns -delirium precautions -consider neurology outpatient for formal evaluation  - consider palliative consult for goals of care conversation  Depression Home meds include duloxetine 30 mg daily. -continue home duloxetine   Vitamin D deficiency Home meds  include cholecalciferol 2,000 units daily.  -continue home supplementation    FEN/GI: heart healthy PPx: heparin ggt   Status is: Inpatient  Remains inpatient appropriate because:Ongoing diagnostic testing needed not appropriate for outpatient work up, IV treatments appropriate due to intensity of illness or inability to take PO and Inpatient level of care appropriate due to severity of illness   Dispo: The patient is from: Home              Anticipated d/c is to: Home              Patient currently is not medically stable to d/c.   Difficult to place patient No   Subjective:  Patient with dementia, denies chest pain, SOB, discomfort of any kind. Son at the bedside.   Objective: Temp:  [97.6 F (36.4 C)] 97.6 F (36.4 C) (04/27 1652) Pulse Rate:  [37-143] 85 (04/28 0700) Resp:  [15-46] 20 (04/28 0700) BP: (93-141)/(48-110) 141/110 (04/28 0700) SpO2:  [83 %-100 %] 96 % (04/28 0700) Physical Exam: General: awake, intermittently alert, shifting around bed Cardiovascular: irregularly irregular rhythm Respiratory: CTAB Extremities: trace pitting edema in BLE  Laboratory: Recent Labs  Lab 11/16/20 1709 11/17/20 0203  WBC 6.1 5.6  HGB 12.7 12.1  HCT 38.0 36.3  PLT 282 260   Recent Labs  Lab 11/16/20 1709 11/17/20 0203  NA 134* 133*  K 5.7* 5.5*  CL 105 104  CO2 19* 18*  BUN 24* 24*  CREATININE 1.81* 1.97*  CALCIUM 9.2 9.4  PROT  --  6.1*  BILITOT  --  1.2  ALKPHOS  --  90  ALT  --  15  AST  --  17  GLUCOSE 107* 83    Imaging/Diagnostic Tests: CHEST - 2 VIEW 11/16/2020 COMPARISON:  None. FINDINGS: Mild cardiomegaly. Normal mediastinal contours. No pulmonary edema. No focal airspace disease, large pleural effusion or pneumothorax. Multilevel degenerative change in the thoracic spine. Superior subluxation of the humeral head abutting the undersurface of the acromion on the right. No acute osseous abnormalities are seen. IMPRESSION: Mild cardiomegaly  without acute pulmonary process.  Fayette Pho, MD 11/17/2020, 7:12 AM PGY-1, [facility] Family Medicine FPTS Intern pager: [phone], text pages welcome

## 2020-11-17 NOTE — ED Notes (Signed)
Md [place] notified of pts elevated aptt at greater than 200

## 2020-11-17 NOTE — ED Notes (Signed)
Stopped heparin per pharmacy request due to elevated aptt.Md ganta informed. RN to restart heparin at a lower dose at 6:45.

## 2020-11-17 NOTE — ED Notes (Signed)
Per lab request aptt blood drawn from right arm due to heparin going in the left arm

## 2020-11-17 NOTE — ED Notes (Signed)
Pt denies having to void, purwick in place

## 2020-11-17 NOTE — Progress Notes (Signed)
ANTICOAGULATION CONSULT NOTE - Follow Up Consult  Pharmacy Consult for heparin Indication: atrial fibrillation  Labs: Recent Labs    11/16/20 1709 11/17/20 0142 11/17/20 0143 11/17/20 0203 11/17/20 0428  HGB 12.7  --   --  12.1  --   HCT 38.0  --   --  36.3  --   PLT 282  --   --  260  --   APTT  --  >200*  --   --  >200*  LABPROT 21.2*  --   --   --   --   INR 1.8*  --   --   --   --   HEPARINUNFRC  --   --  >1.10*  --  >1.10*  CREATININE 1.81*  --   --  1.97*  --     Assessment: 70yo female supratherapeutic on heparin with initial dosing while Eliquis on hold; no gtt issues or signs of bleeding per RN.  Goal of Therapy:  Heparin level 0.3-0.7 units/ml aPTT 66-102 seconds   Plan:  Will hold heparin gtt x1h then decrease heparin gtt to 900 units/hr and check labs in 6-8 hours.    Vernard Gambles, PharmD, BCPS  11/17/2020,5:39 AM

## 2020-11-17 NOTE — Progress Notes (Signed)
PT Cancellation Note  Patient Details Name: Kylie Collins MRN: [id number] DOB: [birth year]   Cancelled Treatment:    Reason Eval/Treat Not Completed: Medical issues which prohibited therapy per chart, HR remains elevated at rest. Will follow from a distance today, plan to initiate PT eval once more medically stable.    Madelaine Etienne, DPT, PN1   Supplemental Physical Therapist [facility]    Pager [phone] Acute Rehab Office [phone]

## 2020-11-17 NOTE — Progress Notes (Signed)
Date and time results received: 11/17/20 1642  Test: PTT Critical Value: greater than 200  Name of Provider Notified: notifying Dr. Deirdre Priest. spoke to Pharmacy, Rexford Maus Pharm D  Orders Received? Or Actions Taken?:  per pharmacy holding heparin for and hour and restarting at a lower rate.

## 2020-11-17 NOTE — ED Notes (Signed)
[phone] called son Maisie Fus when patient ger moved that her son

## 2020-11-18 ENCOUNTER — Inpatient Hospital Stay (HOSPITAL_COMMUNITY): Payer: Medicare HMO

## 2020-11-18 DIAGNOSIS — I4891 Unspecified atrial fibrillation: Secondary | ICD-10-CM | POA: Diagnosis not present

## 2020-11-18 LAB — COMPREHENSIVE METABOLIC PANEL
ALT: 14 U/L (ref 0–44)
AST: 17 U/L (ref 15–41)
Albumin: 2.4 g/dL — ABNORMAL LOW (ref 3.5–5.0)
Alkaline Phosphatase: 88 U/L (ref 38–126)
Anion gap: 10 (ref 5–15)
BUN: 23 mg/dL (ref 8–23)
CO2: 19 mmol/L — ABNORMAL LOW (ref 22–32)
Calcium: 8.9 mg/dL (ref 8.9–10.3)
Chloride: 103 mmol/L (ref 98–111)
Creatinine, Ser: 1.88 mg/dL — ABNORMAL HIGH (ref 0.44–1.00)
GFR, Estimated: 28 mL/min — ABNORMAL LOW (ref >60)
Glucose, Bld: 76 mg/dL (ref 70–99)
Potassium: 4.8 mmol/L (ref 3.5–5.1)
Sodium: 132 mmol/L — ABNORMAL LOW (ref 135–145)
Total Bilirubin: 1.4 mg/dL — ABNORMAL HIGH (ref 0.3–1.2)
Total Protein: 5.4 g/dL — ABNORMAL LOW (ref 6.5–8.1)

## 2020-11-18 LAB — CBC
HCT: 34 % — ABNORMAL LOW (ref 36.0–46.0)
Hemoglobin: 11.5 g/dL — ABNORMAL LOW (ref 12.0–15.0)
MCH: 38.6 pg — ABNORMAL HIGH (ref 26.0–34.0)
MCHC: 33.8 g/dL (ref 30.0–36.0)
MCV: 114.1 fL — ABNORMAL HIGH (ref 80.0–100.0)
Platelets: 208 10*3/uL (ref 150–400)
RBC: 2.98 MIL/uL — ABNORMAL LOW (ref 3.87–5.11)
RDW: 20.2 % — ABNORMAL HIGH (ref 11.5–15.5)
WBC: 5.5 10*3/uL (ref 4.0–10.5)
nRBC: 0.9 % — ABNORMAL HIGH (ref 0.0–0.2)

## 2020-11-18 LAB — ECHOCARDIOGRAM COMPLETE
AR max vel: 1.83 cm2
AV Area VTI: 1.82 cm2
AV Area mean vel: 1.56 cm2
AV Mean grad: 2.3 mmHg
AV Peak grad: 3.3 mmHg
Ao pk vel: 0.91 m/s
Area-P 1/2: 4.31 cm2
Height: 67 in
S' Lateral: 4.5 cm
Weight: 4000 oz

## 2020-11-18 LAB — APTT
aPTT: 50 seconds — ABNORMAL HIGH (ref 24–36)
aPTT: 90 seconds — ABNORMAL HIGH (ref 24–36)
aPTT: 40 seconds — ABNORMAL HIGH (ref 24–36)

## 2020-11-18 LAB — HEPARIN LEVEL (UNFRACTIONATED): Heparin Unfractionated: >1.1 IU/mL — ABNORMAL HIGH (ref 0.30–0.70)

## 2020-11-18 MED ORDER — PERFLUTREN LIPID MICROSPHERE
1.0000 mL | INTRAVENOUS | Status: AC | PRN
  Administered 2020-11-18: 4 mL via INTRAVENOUS
  Filled 2020-11-18: qty 10

## 2020-11-18 MED ORDER — DILTIAZEM HCL ER COATED BEADS 240 MG PO CP24
240.0000 mg | ORAL_CAPSULE | Freq: Every day | ORAL | Status: DC
  Administered 2020-11-18: 240 mg via ORAL
  Filled 2020-11-18: qty 1

## 2020-11-18 MED ORDER — APIXABAN 5 MG PO TABS
5.0000 mg | ORAL_TABLET | Freq: Two times a day (BID) | ORAL | Status: DC
  Administered 2020-11-18 – 2020-11-19 (×2): 5 mg via ORAL
  Filled 2020-11-18 (×2): qty 1

## 2020-11-18 MED ORDER — METOPROLOL SUCCINATE ER 50 MG PO TB24
50.0000 mg | ORAL_TABLET | Freq: Two times a day (BID) | ORAL | Status: DC
  Administered 2020-11-18 – 2020-11-19 (×2): 50 mg via ORAL
  Filled 2020-11-18 (×2): qty 1

## 2020-11-18 MED ORDER — ENSURE ENLIVE PO LIQD: 237.0000 mL | Freq: Two times a day (BID) | ORAL | Status: DC

## 2020-11-18 NOTE — Progress Notes (Signed)
ANTICOAGULATION CONSULT NOTE - Follow Up Consult  Pharmacy Consult for heparin Indication: atrial fibrillation  No Known Allergies  Patient Measurements: Height: 5\' 7"  (170.2 cm) Weight: 113.4 kg (250 lb) IBW/kg (Calculated) : 61.6 Heparin Dosing Weight: 87.9 kg  Vital Signs: Temp: 97.9 F (36.6 C) (04/29 0032) Temp Source: Axillary (04/29 0032) BP: 114/85 (04/29 0032) Pulse Rate: 97 (04/29 0032)  Labs: Recent Labs    11/16/20 1709 11/17/20 0142 11/17/20 0203 11/17/20 0428 11/17/20 1523 11/18/20 0200  HGB 12.7  --  12.1  --   --  11.5*  HCT 38.0  --  36.3  --   --  34.0*  PLT 282  --  260  --   --  208  APTT  --    < >  --  >200* >200* 50*  LABPROT 21.2*  --   --   --   --   --   INR 1.8*  --   --   --   --   --   HEPARINUNFRC  --    < >  --  >1.10* >1.10* >1.10*  CREATININE 1.81*  --  1.97*  --  1.97* 1.88*   < > = values in this interval not displayed.    Estimated Creatinine Clearance: 35.7 mL/min (A) (by C-G formula based on SCr of 1.88 mg/dL (H)).   Assessment: 70 yo female presenting with Afib in RVR.  Patient is on Eliquis PTA and her son reports compliance with medication- states she lives with him and he watches her take it twice daily.  Last PTA dose appears to be 4/27 AM.  Pharmacy consulted to dose IV heparin.  Awaiting EP rec's before transitioning back to PO anticoagulation.   HL > 1.1 (affected by Eliquis). aPTT had been elevated >200. Now PTT is down to subtherapeutic (50 sec) on gtt at 700 units/hr. No issues with line or bleeding reported per RN.  Goal of Therapy:  Heparin level 0.3-0.7 units/ml  aPTT 66-102 seconds Monitor platelets by anticoagulation protocol: Yes   Plan:  Increase heparin to 850 units/hr F/u aPTT in 8 hours   5/27, PharmD, BCPS Please see amion for complete clinical pharmacist phone list 11/18/2020 3:42 AM

## 2020-11-18 NOTE — Progress Notes (Incomplete)
  Echocardiogram 2D Echocardiogram has been performed.  Kylie Collins 11/18/2020, 11:53 AM

## 2020-11-18 NOTE — Care Management (Signed)
1700 11-18-20 Patient presented for Atrial Fib with RVR. Patient is currently active with [facility] for home health registered nurse, physical/occupational therapies and a home health aide. [facility] is aware that the patient is hospitalized. MD sent a secure chat message regarding plan for home on Saturday. Orders F2F obtained for resumption. No further needs from Case Manager at this time. Gala Lewandowsky, RN,BSN Case Manager

## 2020-11-18 NOTE — Progress Notes (Signed)
Family Medicine Teaching Service Daily Progress Note Intern Pager: [phone]  Patient name: Kylie Collins Medical record number: [id number] Date of birth: [birth year] Age: 70 y.o. Gender: female  Primary Care Provider: Pcp, No Consultants: Cardiology Code Status: Full  Pt Overview and Major Events to Date:  4/27 admitted  Assessment and Plan: Kylie Collins a 70 y.o.femalepresenting with atrial fibrillation with RVR. PMH is significant foratrial fibrillation, CKD, depression and vitamin D deficiency.  Atrial fibrillation with RVR Patient comfortable, no complaints.  No chest pain.  EP does not believe she is a good candidate for ablation or cardioversion.  To medically manage.  P.o. diltiazem increased to 240. - cardiology conuslted, appreciate ongoing care - heparin per pharmacy/cards, anticipate switch to home Eliquis today - awaiting echo read -Expect discharge home with son and home health tomorrow  Hypotension Hx of Hypertension Normotensive overnight with soft blood pressures, systolics in 100s, 110s.  Memory concerns -delirium precautions -consider neurology outpatient for formal evaluation - consider palliative consult for goals of care conversation  Depression Home meds include duloxetine 30 mg daily. -continue home duloxetine  Vitamin D deficiency Home meds include cholecalciferol 2,000 units daily.  -continue home supplementation   FEN/GI: heart healthy PPx: heparin ggt, anticipate switch to Eliquis today   Status is: Inpatient  Remains inpatient appropriate because:home tomorrow with son and HHPT   Dispo: The patient is from: Home              Anticipated d/c is to: Home              Patient currently is not medically stable to d/c.   Difficult to place patient No  Subjective:  Patient sleeping comfortably in bed with son at bedside. No complaints. Denies pain, chest pain, shortness of breath.  Updated son at bedside with plan.   Discussed that she is not a good candidate for cardioversion or ablation.  Discussed rate control with medication.  Asked if palliative consult for goals of care will be okay, son amenable to this.  Objective: Temp:  [96.7 F (35.9 C)-97.9 F (36.6 C)] 97.4 F (36.3 C) (04/29 0912) Pulse Rate:  [67-114] 101 (04/29 0912) Resp:  [12-16] 16 (04/29 0912) BP: (103-114)/(72-88) 103/72 (04/29 0912) SpO2:  [95 %-100 %] 97 % (04/29 0912) Weight:  [113.4 kg] 113.4 kg (04/28 1326) Physical Exam: General: Sleepy, awakes to voice, minimally interactive, no acute distress Cardiovascular: Irregularly irregular rhythm, pulse 95-115 Respiratory: Clear to auscultation bilaterally Abdomen: Soft, nondistended, nontender  Laboratory: Recent Labs  Lab 11/16/20 1709 11/17/20 0203 11/18/20 0200  WBC 6.1 5.6 5.5  HGB 12.7 12.1 11.5*  HCT 38.0 36.3 34.0*  PLT 282 260 208   Recent Labs  Lab 11/17/20 0203 11/17/20 1523 11/18/20 0200  NA 133* 133* 132*  K 5.5* 4.9 4.8  CL 104 104 103  CO2 18* 20* 19*  BUN 24* 25* 23  CREATININE 1.97* 1.97* 1.88*  CALCIUM 9.4 9.1 8.9  PROT 6.1*  --  5.4*  BILITOT 1.2  --  1.4*  ALKPHOS 90  --  88  ALT 15  --  14  AST 17  --  17  GLUCOSE 83 86 76    Imaging/Diagnostic Tests: Awaiting echo results  Fayette Pho, MD 11/18/2020, 12:01 PM PGY-1, [facility] Family Medicine FPTS Intern pager: [phone], text pages welcome

## 2020-11-18 NOTE — Progress Notes (Signed)
ANTICOAGULATION CONSULT NOTE - Follow Up Consult  Pharmacy Consult for heparin Indication: atrial fibrillation  No Known Allergies  Patient Measurements: Height: 5\' 7"  (170.2 cm) Weight: 113.4 kg (250 lb) IBW/kg (Calculated) : 61.6 Heparin Dosing Weight: 87.9 kg  Vital Signs: Temp: 97.4 F (36.3 C) (04/29 0912) Temp Source: Oral (04/29 0912) BP: 103/72 (04/29 0912) Pulse Rate: 101 (04/29 0912)  Labs: Recent Labs    11/16/20 1709 11/17/20 0142 11/17/20 0203 11/17/20 0428 11/17/20 1523 11/18/20 0200 11/18/20 1208  HGB 12.7  --  12.1  --   --  11.5*  --   HCT 38.0  --  36.3  --   --  34.0*  --   PLT 282  --  260  --   --  208  --   APTT  --    < >  --  >200* >200* 50* 90*  LABPROT 21.2*  --   --   --   --   --   --   INR 1.8*  --   --   --   --   --   --   HEPARINUNFRC  --    < >  --  >1.10* >1.10* >1.10*  --   CREATININE 1.81*  --  1.97*  --  1.97* 1.88*  --    < > = values in this interval not displayed.    Estimated Creatinine Clearance: 35.7 mL/min (A) (by C-G formula based on SCr of 1.88 mg/dL (H)).   Assessment: 70 yo female presenting with Afib in RVR.  Patient is on Eliquis PTA and her son reports compliance with medication- states she lives with him and he watches her take it twice daily.  Last PTA dose appears to be 4/27 AM.  Pharmacy consulted to dose IV heparin.  Awaiting EP rec's before transitioning back to PO anticoagulation.   HL > 1.1 (affected by Eliquis). aPTT had been elevated >200, now aPTT is therapeutic (90 sec) on gtt at 850 units/hr. No issues with line or bleeding reported per RN.  Goal of Therapy:  Heparin level 0.3-0.7 units/ml  aPTT 66-102 seconds Monitor platelets by anticoagulation protocol: Yes   Plan:  Continue heparin at 850 units/hr F/u confirmatory aPTT in 8 hours Monitor for s/sx bleeding  5/27, PharmD PGY-1 Acute Care Pharmacy Resident Office: [phone] 11/18/2020 1:14 PM

## 2020-11-18 NOTE — Evaluation (Signed)
Occupational Therapy Evaluation Patient Details Name: Kylie Collins MRN: [id number] DOB: [birth year] Today's Date: 11/18/2020    History of Present Illness Patient is a 70 y/o female who presents on 11/16/20 from Cardiology office due to A-fib with RVR. PMH includes dementia, CKD, depression.   Clinical Impression   Pt admitted to ED for concerns listed above. PTA pt required max assist for transfers, assist for all ADL's, and cueing for sequencing with most tasks. Pt's son in the room providing all prior levels and assist with goals setting. During the evaluation, pt demonstrated strong UE strength and full/functional ROM. Bed mobility requires minimal assist and pt can complete upper body ADL's with set up and min guard for safety/balance concerns. OT will continue to follow to assist with increasing performance in ADL's.    Follow Up Recommendations  Home health OT    Equipment Recommendations  None recommended by OT    Recommendations for Other Services       Precautions / Restrictions Precautions Precautions: Fall;Other (comment) Precaution Comments: watch HR Restrictions Weight Bearing Restrictions: No      Mobility Bed Mobility Overal bed mobility: Needs Assistance Bed Mobility: Supine to Sit;Sit to Supine;Rolling Rolling: Mod assist   Supine to sit: Min assist Sit to supine: Min assist   General bed mobility comments: Able to come into long sitting without assist; assist with bring LLE to EOB and bring back into bed. With rolling, pt initiates rolling and needs assistance with fulling rolling to 1 side for repositioning and bathing    Transfers                 General transfer comment: Deferred. Pt Max A to transfer at home to [facility].    Balance Overall balance assessment: Needs assistance Sitting-balance support: Feet unsupported;No upper extremity supported Sitting balance-Leahy Scale: Fair                                     ADL either  performed or assessed with clinical judgement   ADL Overall ADL's : Needs assistance/impaired Eating/Feeding: Set up;Sitting Eating/Feeding Details (indicate cue type and reason): can bring cup to mouth and take sips Grooming: Wash/dry face;Min guard;Sitting Grooming Details (indicate cue type and reason): can complete seated EOB Upper Body Bathing: Maximal assistance;Bed level   Lower Body Bathing: Total assistance;+2 for physical assistance;+2 for safety/equipment;Bed level   Upper Body Dressing : Minimal assistance;Sitting Upper Body Dressing Details (indicate cue type and reason): can complete sitting EOB Lower Body Dressing: Maximal assistance;+2 for physical assistance;Sitting/lateral leans;Sit to/from stand Lower Body Dressing Details (indicate cue type and reason): Attempts to help with pulling up socks, needs vernal cueing for sequencing and safety Toilet Transfer: Total assistance;+2 for physical assistance;+2 for safety/equipment;Squat-pivot;BSC   Toileting- Clothing Manipulation and Hygiene: Total assistance;Bed level   Tub/ Shower Transfer: Total assistance;+2 for physical assistance;+2 for safety/equipment;Squat-pivot   Functional mobility during ADLs: Total assistance;+2 for physical assistance;+2 for safety/equipment General ADL Comments: Pt requires verbal cuing for sequencing and initiation of ADL's. Pt able to assist with all upper body ADL's, needs max assist for all other ADL's     Vision   Additional Comments: Required cueing to open her eyes and look at what she is doing (ie touching OT's hands with her hands)     Perception Perception Perception Tested?: No   Praxis Praxis Praxis tested?: Not tested    Pertinent Vitals/Pain Pain  Assessment: Faces Faces Pain Scale: Hurts little more Pain Location: bottom Pain Descriptors / Indicators: Tender;Sore Pain Intervention(s): Limited activity within patient's tolerance;Repositioned;Monitored during session      Hand Dominance Right   Extremity/Trunk Assessment Upper Extremity Assessment Upper Extremity Assessment: Overall WFL for tasks assessed   Lower Extremity Assessment Lower Extremity Assessment: Defer to PT evaluation   Cervical / Trunk Assessment Cervical / Trunk Assessment: Kyphotic   Communication Communication Communication: No difficulties   Cognition Arousal/Alertness: Awake/alert Behavior During Therapy: WFL for tasks assessed/performed Overall Cognitive Status: History of cognitive impairments - at baseline Area of Impairment: Orientation;Memory                 Orientation Level: Disoriented to;Place;Time;Situation   Memory: Decreased short-term memory         General Comments: Oriented to self only. Son reports she is at cognitive baseline and it waxes and wanes based on how much sleep she has had.   General Comments  Son present and providing all info relating to PLOF/history. HR up to 130 bpm max.    Exercises     Shoulder Instructions      Home Living Family/patient expects to be discharged to:: Private residence Living Arrangements: Children Available Help at Discharge: Family;Available 24 hours/day Type of Home: House Home Access: Ramped entrance     Home Layout: Two level;Able to live on main level with bedroom/bathroom     Bathroom Shower/Tub: Other (comment) (Pt's family provides pt with bird baths)   Bathroom Toilet: Handicapped height (BSC) Bathroom Accessibility: No   Home Equipment: Walker - 2 wheels;Bedside commode;Shower seat;Grab bars - toilet;Grab bars - tub/shower;Wheelchair - manual   Additional Comments: lift chair      Prior Functioning/Environment Level of Independence: Needs assistance  Gait / Transfers Assistance Needed: Non ambulatory for ~2-3 months per son. Recently moved in with him in March. Max A for SPT to [facility]. ADL's / Homemaking Assistance Needed: Has an aide come in once/week to assist with bathing.  Daughter in law bathes pt every night. Pt assists with dressing and some ADLs at bed level.            OT Problem List: Decreased strength;Decreased activity tolerance;Impaired balance (sitting and/or standing);Decreased cognition;Decreased safety awareness;Cardiopulmonary status limiting activity      OT Treatment/Interventions: Self-care/ADL training;Therapeutic exercise;Energy conservation;DME and/or AE instruction;Therapeutic activities;Cognitive remediation/compensation;Patient/family education;Balance training    OT Goals(Current goals can be found in the care plan section) Acute Rehab OT Goals Patient Stated Goal: To stand up OT Goal Formulation: With patient/family Time For Goal Achievement: 12/02/20 Potential to Achieve Goals: Fair ADL Goals Pt Will Perform Grooming: with set-up;with supervision;sitting Pt Will Perform Upper Body Bathing: with set-up;with supervision;sitting Pt Will Transfer to Toilet: with mod assist;stand pivot transfer;bedside commode Additional ADL Goal #1: Pt will attempt to stand with mod support and RW as needed.  OT Frequency: Min 2X/week   Barriers to D/C:            Co-evaluation              AM-PAC OT "6 Clicks" Daily Activity     Outcome Measure Help from another person eating meals?: A Little Help from another person taking care of personal grooming?: A Little Help from another person toileting, which includes using toliet, bedpan, or urinal?: Total Help from another person bathing (including washing, rinsing, drying)?: Total Help from another person to put on and taking off regular upper body clothing?: A Little Help from another  person to put on and taking off regular lower body clothing?: Total 6 Click Score: 12   End of Session Nurse Communication: Mobility status;Other (comment) (Wound on Pt back bleeding.)  Activity Tolerance: Patient limited by lethargy;Patient tolerated treatment well Patient left: in bed;with call  bell/phone within reach;with bed alarm set;with family/visitor present;with nursing/sitter in room  OT Visit Diagnosis: Other abnormalities of gait and mobility (R26.89);Muscle weakness (generalized) (M62.81);History of falling (Z91.81)                Time: 3235-5732 OT Time Calculation (min): 34 min Charges:  OT General Charges $OT Visit: 1 Visit OT Evaluation $OT Eval Moderate Complexity: 1 Mod OT Treatments $Self Care/Home Management : 8-22 mins  Solymar Grace H., OTR/L Acute Rehabilitation  Tearah Saulsbury Elane Sahej Schrieber 11/18/2020, 1:29 PM

## 2020-11-18 NOTE — Progress Notes (Addendum)
Progress Note  Patient Name: Kylie Collins Date of Encounter: 11/18/2020  [facility] Cardiologist: Dr. Wille Glaser [facility])  Subjective   Much more interactive this morning, son at bedside I think helps, and reportedly slept very well.  She denies any CP, SOB  Inpatient Medications    Scheduled Meds:  cholecalciferol  2,000 Units Oral Daily   diltiazem  240 mg Oral Daily   dronabinol  2.5 mg Oral BID WC   DULoxetine  30 mg Oral QHS   gabapentin  100 mg Oral QHS   Continuous Infusions:  heparin 850 Units/hr (11/18/20 0351)   PRN Meds:    Vital Signs    Vitals:   11/17/20 1719 11/17/20 1732 11/18/20 0032 11/18/20 0400  BP: 104/77 104/77 114/85 103/72  Pulse:   97 99  Resp:   14 16  Temp:   97.9 F (36.6 C) 97.6 F (36.4 C)  TempSrc:   Axillary Axillary  SpO2:   100% 97%  Weight:      Height:        Intake/Output Summary (Last 24 hours) at 11/18/2020 0853 Last data filed at 11/18/2020 0351 Gross per 24 hour  Intake 259.34 ml  Output --  Net 259.34 ml   Last 3 Weights 11/17/2020  Weight (lbs) 250 lb  Weight (kg) 113.399 kg      Telemetry    Afib 110's - Personally Reviewed  ECG    No new EKGs - Personally Reviewed  Physical Exam   GEN: No acute distress.   Neck: No JVD Cardiac: irreg-irreg, soft SM, no rubs, or gallops.  Respiratory: CTA b/l. GI: Soft, nontender, non-distended  MS: No edema; No deformity. Neuro:  Nonfocal  Psych: Normal affect   Labs    High Sensitivity Troponin:  No results for input(s): TROPONINIHS in the last 720 hours.    Chemistry Recent Labs  Lab 11/17/20 0203 11/17/20 1523 11/18/20 0200  NA 133* 133* 132*  K 5.5* 4.9 4.8  CL 104 104 103  CO2 18* 20* 19*  GLUCOSE 83 86 76  BUN 24* 25* 23  CREATININE 1.97* 1.97* 1.88*  CALCIUM 9.4 9.1 8.9  PROT 6.1*  --  5.4*  ALBUMIN 2.7*  --  2.4*  AST 17  --  17  ALT 15  --  14  ALKPHOS 90  --  88  BILITOT 1.2  --  1.4*  GFRNONAA 27* 27* 28*   ANIONGAP 11 9 10      Hematology Recent Labs  Lab 11/16/20 1709 11/17/20 0203 11/18/20 0200  WBC 6.1 5.6 5.5  RBC 3.29* 3.15* 2.98*  HGB 12.7 12.1 11.5*  HCT 38.0 36.3 34.0*  MCV 115.5* 115.2* 114.1*  MCH 38.6* 38.4* 38.6*  MCHC 33.4 33.3 33.8  RDW 20.6* 20.5* 20.2*  PLT 282 260 208    BNPNo results for input(s): BNP, PROBNP in the last 168 hours.   DDimer No results for input(s): DDIMER in the last 168 hours.   Radiology    DG Chest 2 View Result Date: 11/16/2020 CLINICAL DATA:  Atrial fibrillation. EXAM: CHEST - 2 VIEW COMPARISON:  None. FINDINGS: Mild cardiomegaly. Normal mediastinal contours. No pulmonary edema. No focal airspace disease, large pleural effusion or pneumothorax. Multilevel degenerative change in the thoracic spine. Superior subluxation of the humeral head abutting the undersurface of the acromion on the right. No acute osseous abnormalities are seen. IMPRESSION: Mild cardiomegaly without acute pulmonary process. Electronically Signed   By: 11/18/2020  M.D.   On: 11/16/2020 17:56    Cardiac Studies   Echo still pending, ordered yesterday  Patient Profile     70 y.o. female w/PMHx of permanent AFib, HTN > relative hypotension, CKD (III), NICM is mentioned in her paperwork, OA, pre-DM.  Admitted from her cardiologist office 2/2 AFib w/ RVR   Assessment & Plan    1. Permanent AFib     CHA2DSD2Vasc is 4, on Eliquis outpt appropriately dosed     Heparin gtt here  dilt gtt transitioned to PO yesterday, rates 110's > titrate  Her son is at bedside today Her reports many years of AFib and in d/w her cardiologist, they specifically told him to bring her here for EP to see and consider ablation.  (paperwork from son copied and placed in shadow chart)  Dr. Elberta Fortis has seen/examined the patient and spoken with her son Not a PVI ablation candidate given presumed permanent AFib suounds like her BP has been limiting in titrating her rate control out  pt with BB  We Kylie Collins work with dilt here and see, try to avoid pace/ablate if we can, BP is tolerating so far  Echo results may change the plan and Kylie Collins keep hep gtt for now until we see her echo.   2. NICM noted in her paperwork     No careverywhere records found     Await her echo     Exam does not suggest volume OL, CXR clear   3. Suspect dementia     Son reports she is much clearer after a good night's sleep, though what we see her is her baseline       Deferred to medicine team    For questions or updates, please contact [facility] Please consult [email or web address] for contact info under        Signed, Kylie Pigeon, PA-C  11/18/2020, 8:53 AM    I have seen and examined this patient with Kylie Collins.  Agree with above, note added to reflect my findings.  On exam, irregular, no murmurs.  Patient with continued atrial fibrillation.  Rates have been right around 100-120.  Kylie Collins increase diltiazem to 240 mg.  Continue anticoagulation.  Kylie Collins M. Jeramie Scogin MD 11/18/2020 9:46 AM

## 2020-11-18 NOTE — Progress Notes (Signed)
Initial Nutrition Assessment  DOCUMENTATION CODES:   Obesity unspecified  INTERVENTION:  Provide Ensure Enlive po BID, each supplement provides 350 kcal and 20 grams of protein.  Encourage adequate PO intake.   NUTRITION DIAGNOSIS:   Increased nutrient needs related to acute illness as evidenced by estimated needs.  GOAL:   Patient will meet greater than or equal to 90% of their needs  MONITOR:   PO intake,Supplement acceptance,Skin,Weight trends,Labs,I & O's  REASON FOR ASSESSMENT:   Consult Assessment of nutrition requirement/status  ASSESSMENT:   70 y.o. female presenting with atrial fibrillation with RVR. PMH is significant for atrial fibrillation, CKD, depression and vitamin D deficiency  Pt unavailable during time of visit. RD unable to obtain pt nutrition history at this time. Noted breakfast meal tray untouched this morning. RD to order nutritional supplements to aid in caloric and protein needs. Unable to complete Nutrition-Focused physical exam at this time.   Labs and medications reviewed.   Diet Order:   Diet Order            Diet Heart Room service appropriate? Yes; Fluid consistency: Thin  Diet effective now                 EDUCATION NEEDS:   Not appropriate for education at this time  Skin:  Skin Assessment: Skin Integrity Issues: Skin Integrity Issues:: Other (Comment) Other: non-pressure wound L buttocks  Last BM:  4/28  Height:   Ht Readings from Last 1 Encounters:  11/17/20 5\' 7"  (1.702 m)    Weight:   Wt Readings from Last 1 Encounters:  11/17/20 113.4 kg   BMI:  Body mass index is 39.16 kg/m.  Estimated Nutritional Needs:   Kcal:  1850-2050  Protein:  95-110 grams  Fluid:  >/= 1.8 L/day  11/19/20, MS, RD, LDN RD pager number/after hours weekend pager number on Amion.

## 2020-11-18 NOTE — Progress Notes (Signed)
Echo reviewed, LVEF 30-35% She carries a known diagnosis of NICM by paperwork we got today She does not appear volume OL.  Will change diltiazem to Toprol. Her BP looks good Stop heparin and resume Eliquis  Follow HRs  Francis Dowse, PA-C

## 2020-11-18 NOTE — Evaluation (Signed)
Physical Therapy Evaluation Patient Details Name: Kylie Collins MRN: [id number] DOB: April 29, 1951 Today's Date: 11/18/2020   History of Present Illness  Patient is a 70 y/o female who presents on 11/16/20 from Cardiology office due to A-fib with RVR. PMH includes dementia, CKD, depression.  Clinical Impression  Patient presents with generalized weakness, deconditioning, baseline cognitive deficits, fatigue and impaired mobility s/p above. Pt lives at home with son and daughter in law and has 24/7 supervision/assist with ADLs/medication management and mobility. Pt non ambulatory and requires Max A for SPT to [facility] and w/c. Able to get to EOB with Min A for LLE management. Son reports some concerns with pressure sores on bottom esp for longer car trips and when sitting in w/c. Provided info on pressure relief techniques with hospital bed and overlay mattress, foam pads as well as roho cushion for w/c for home. HR up to 125 bpm A-fib max with activity. Pt motivated to improve function and strength and to be able to walk again. Recommend continuing HH services and maximizing them as well. Will follow acutely to maximize independence and mobility prior to return home.    Follow Up Recommendations Home health PT;Supervision for mobility/OOB    Equipment Recommendations  Hospital bed (with overlay mattress pending coverage)    Recommendations for Other Services       Precautions / Restrictions Precautions Precautions: Fall;Other (comment) Precaution Comments: watch HR Restrictions Weight Bearing Restrictions: No      Mobility  Bed Mobility Overal bed mobility: Needs Assistance Bed Mobility: Supine to Sit     Supine to sit: Min assist     General bed mobility comments: Able to come into long sitting without assist; assist with bring LLE to EOB and bring back into bed.    Transfers                 General transfer comment: Deferred. Pt Max A to transfer at home to  [facility].  Ambulation/Gait                Stairs            Wheelchair Mobility    Modified Rankin (Stroke Patients Only)       Balance Overall balance assessment: Needs assistance Sitting-balance support: Feet unsupported;No upper extremity supported Sitting balance-Leahy Scale: Fair                                       Pertinent Vitals/Pain Pain Assessment: Faces Faces Pain Scale: Hurts little more Pain Location: bottom Pain Descriptors / Indicators: Tender;Sore Pain Intervention(s): Monitored during session;Repositioned    Home Living Family/patient expects to be discharged to:: Private residence Living Arrangements: Children (son and daughter in law) Available Help at Discharge: Family;Available 24 hours/day Type of Home: House Home Access: Ramped entrance     Home Layout: Two level;Able to live on main level with bedroom/bathroom Home Equipment: Dan Humphreys - 2 wheels;Wheelchair - [employer];Other (comment) Additional Comments: lift chair    Prior Function Level of Independence: Needs assistance   Gait / Transfers Assistance Needed: Non ambulatory for ~6-8 months per son. Recently moved in with him in March. Max A for SPT to [facility].  ADL's / Homemaking Assistance Needed: Has an aide come in once/week to assist with bathing. Daughter in law bathes pt every night. Pt assists with dressing and some ADLs at bed level.  Hand Dominance   Dominant Hand: Right    Extremity/Trunk Assessment   Upper Extremity Assessment Upper Extremity Assessment: Defer to OT evaluation (RUE weaker than LUE but AROM WFLs)    Lower Extremity Assessment Lower Extremity Assessment: Generalized weakness (Grossly ~2/5 throughout BLEs, decreased sensation BLEs distal to knee and into feet)    Cervical / Trunk Assessment Cervical / Trunk Assessment: Kyphotic  Communication   Communication: No difficulties  Cognition Arousal/Alertness:  Awake/alert Behavior During Therapy: WFL for tasks assessed/performed Overall Cognitive Status: History of cognitive impairments - at baseline Area of Impairment: Orientation;Memory                 Orientation Level: Disoriented to;Place;Time;Situation   Memory: Decreased short-term memory         General Comments: Oriented to self only. Son reports she is at cognitive baseline and it waxes and wanes based on how much sleep she has had.      General Comments General comments (skin integrity, edema, etc.): Son present and providing all info relating to PLOF/history. HR up to 125 bpm max A-fib with mobility.    Exercises     Assessment/Plan    PT Assessment Patient needs continued PT services  PT Problem List Decreased strength;Decreased mobility;Obesity;Decreased balance;Impaired sensation;Decreased knowledge of use of DME;Decreased cognition;Decreased activity tolerance;Decreased skin integrity;Cardiopulmonary status limiting activity;Decreased range of motion       PT Treatment Interventions Therapeutic activities;Gait training;Therapeutic exercise;Patient/family education;Cognitive remediation;DME instruction;Wheelchair mobility training;Functional mobility training;Balance training    PT Goals (Current goals can be found in the Care Plan section)  Acute Rehab PT Goals Patient Stated Goal: to be able to walk again PT Goal Formulation: With patient/family Time For Goal Achievement: 12/02/20 Potential to Achieve Goals: Fair    Frequency Min 3X/week   Barriers to discharge        Co-evaluation               AM-PAC PT "6 Clicks" Mobility  Outcome Measure Help needed turning from your back to your side while in a flat bed without using bedrails?: A Little Help needed moving from lying on your back to sitting on the side of a flat bed without using bedrails?: A Little Help needed moving to and from a bed to a chair (including a wheelchair)?: A Lot Help  needed standing up from a chair using your arms (e.g., wheelchair or bedside chair)?: A Lot Help needed to walk in hospital room?: Total Help needed climbing 3-5 steps with a railing? : Total 6 Click Score: 12    End of Session   Activity Tolerance: Patient limited by fatigue;Patient tolerated treatment well Patient left: in bed;with call bell/phone within reach;with bed alarm set;with family/visitor present Nurse Communication: Mobility status;Need for lift equipment PT Visit Diagnosis: Pain;Muscle weakness (generalized) (M62.81);Difficulty in walking, not elsewhere classified (R26.2) Pain - part of body:  (bottom)    Time: 1610-9604 PT Time Calculation (min) (ACUTE ONLY): 39 min   Charges:   PT Evaluation $PT Eval Moderate Complexity: 1 Mod PT Treatments $Therapeutic Activity: 23-37 mins        Vale Haven, PT, DPT Acute Rehabilitation Services Pager [phone] Office [phone]      Blake Divine A Jovonna Nickell 11/18/2020, 9:00 AM

## 2020-11-19 DIAGNOSIS — Z515 Encounter for palliative care: Secondary | ICD-10-CM

## 2020-11-19 LAB — BASIC METABOLIC PANEL
Anion gap: 11 (ref 5–15)
BUN: 21 mg/dL (ref 8–23)
CO2: 19 mmol/L — ABNORMAL LOW (ref 22–32)
Calcium: 8.9 mg/dL (ref 8.9–10.3)
Chloride: 103 mmol/L (ref 98–111)
Creatinine, Ser: 1.64 mg/dL — ABNORMAL HIGH (ref 0.44–1.00)
GFR, Estimated: 33 mL/min — ABNORMAL LOW (ref >60)
Glucose, Bld: 74 mg/dL (ref 70–99)
Potassium: 4.4 mmol/L (ref 3.5–5.1)
Sodium: 133 mmol/L — ABNORMAL LOW (ref 135–145)

## 2020-11-19 LAB — CBC
HCT: 33.1 % — ABNORMAL LOW (ref 36.0–46.0)
Hemoglobin: 11.5 g/dL — ABNORMAL LOW (ref 12.0–15.0)
MCH: 38.2 pg — ABNORMAL HIGH (ref 26.0–34.0)
MCHC: 34.7 g/dL (ref 30.0–36.0)
MCV: 110 fL — ABNORMAL HIGH (ref 80.0–100.0)
Platelets: 232 10*3/uL (ref 150–400)
RBC: 3.01 MIL/uL — ABNORMAL LOW (ref 3.87–5.11)
RDW: 20.1 % — ABNORMAL HIGH (ref 11.5–15.5)
WBC: 5.3 10*3/uL (ref 4.0–10.5)
nRBC: 0.6 % — ABNORMAL HIGH (ref 0.0–0.2)

## 2020-11-19 MED ORDER — METOPROLOL SUCCINATE ER 50 MG PO TB24: 50.0000 mg | ORAL_TABLET | Freq: Two times a day (BID) | ORAL | 0 refills | Status: AC

## 2020-11-19 MED ORDER — ENSURE ENLIVE PO LIQD: 237.0000 mL | Freq: Two times a day (BID) | ORAL | 12 refills | Status: AC

## 2020-11-19 NOTE — Discharge Instructions (Signed)
Dear Kylie Collins,  Thank you for letting us participate in your care. You were hospitalized for atrial fibrillation and treated with medications to control your heart rate.   POST-HOSPITAL & CARE INSTRUCTIONS 1. Follow-up with your cardiologist Dr. Wille Glaser of the [facility] medical group. 2. Continue your regular home Eliquis as prescribed.  This is your blood thinner to prevent blood clots from forming because of your irregular heart rhythm. 3. Continue your new heart rate medication called Toprol-XL.  You will take 1 tablet twice daily.  This medication is to help keep your heart rate at a normal rate (between 70 and 100). 4. Make sure to follow-up with your primary care provider (PCP). 5. The information below includes outpatient palliative care team.  They can reach out to you and help discuss goals of care for your future healthcare treatments. 6. Go to your follow up appointments (listed below)   DOCTOR'S APPOINTMENT   No future appointments.  Follow-up Information    [facility] Follow up.   Specialty: Home Health Services Why: Registered Nurse, Physical Therapy, Occupational Therapy, Aide-office to call with a visit time.  Contact information: [address] [place] [address] [phone]        [facility] Follow up.   Specialty: PALLIATIVE CARE Why: [facility] for palliative services Contact information: [address] [facility] [place] [address] [phone]       Regan Lemming, MD. Schedule an appointment as soon as possible for a visit in 1 week(s).   Specialty: Cardiology Why: Make a hospital follow up appointment with your regular cardiolgist. This should be in 1-2 weeks after discharge. Your cardiologist can request records from the hsopital.  Contact information: [address] [place] [address] [phone]               Take care and be well!  Family  Medicine Teaching Service Inpatient Team [place]  [facility]  [address] [place], [place] [address] [phone]

## 2020-11-19 NOTE — TOC Transition Note (Signed)
Transition of Care [facility]) - CM/SW Discharge Note   Patient Details  Name: Kylie Collins MRN: [id number] Date of Birth: 31-Jan-1951  Transition of Care [facility]) CM/SW Contact:  Lawerance Sabal, RN Phone Number: 11/19/2020, 11:53 AM   Clinical Narrative:    Sherron Monday w patient's son Maisie Fus. He states that patient is active with [facility] services. Will need HH orders for DC. He states there are no DME needs for DC. He is agreeable to outpatient palliative referral. Discussed providers and referral made to [facility]. They will reach out to the son Monday.  Van,Thomas Son   [phone]            Patient Goals and CMS Choice Patient states their goals for this hospitalization and ongoing recovery are:: to return home CMS Medicare.gov Compare Post Acute Care list provided to:: Other (Comment Required) Choice offered to / list presented to : Adult Children  Discharge Placement                       Discharge Plan and Services   Discharge Planning Services: CM Consult Post Acute Care Choice: Home Health          DME Arranged: N/A           HH Agency: [facility] Date HH Agency Contacted: 11/19/20 Time HH Agency Contacted: 1153 Representative spoke with at [facility] Agency: Tomma Lightning  Social Determinants of Health (SDOH) Interventions     Readmission Risk Interventions No flowsheet data found.

## 2020-11-19 NOTE — Progress Notes (Signed)
Palliative care consulted yesterday 4/29 for non-urgent goals of care discussion. Noticed no note in chart as of this morning, 4/30.  Attempted to contact palliative using phone number on MI on, [phone].  No answer, left HIPAA safe voicemail.  If palliative not planning on seeing her today for goals of care conversation, given that she is medically stable and cardiology has signed off, will likely discharge today with outpatient goals of care follow-up.  Fayette Pho, MD

## 2020-11-19 NOTE — Progress Notes (Signed)
   Palliative Medicine Inpatient Follow Up Note  Palliative care consulted to have a nonurgent goals of care conversation with [facility].    I spoke to family medicine resident Dr. Larita Fife and shared with her that the palliative care team is backed up on consults and we will likely not get to [facility] today.  We further discussed verifying she is arranged with outpatient palliative support to start these important conversations.    I was able to send a secure chat to both the family medicine team, Dr. Larita Fife and transitions of care case manager, Lawerance Sabal to get this process started.  No charge. ______________________________________________________________________________________ Lamarr Lulas [place] Palliative Medicine Team Team Cell Phone: [phone] Please utilize secure chat with additional questions, if there is no response within 30 minutes please call the above phone number  Palliative Medicine Team providers are available by phone from 7am to 7pm daily and can be reached through the team cell phone.  Should this patient require assistance outside of these hours, please call the patient's attending physician.

## 2020-11-19 NOTE — Progress Notes (Signed)
Progress Note  Patient Name: Kylie Collins Date of Encounter: 11/19/2020  [facility] Cardiologist: Dr. Wille Glaser [facility] medical group)  Subjective   Continues to be more interactive.  She slept well overnight.  Her son is at the bedside.  She denies chest pain or shortness of breath.    Inpatient Medications    Scheduled Meds: . apixaban  5 mg Oral BID  . cholecalciferol  2,000 Units Oral Daily  . dronabinol  2.5 mg Oral BID WC  . DULoxetine  30 mg Oral QHS  . feeding supplement  237 mL Oral BID BM  . gabapentin  100 mg Oral QHS  . metoprolol succinate  50 mg Oral BID   Continuous Infusions:  PRN Meds:    Vital Signs    Vitals:   11/18/20 0912 11/18/20 1418 11/18/20 2100 11/19/20 0655  BP: 103/72 110/78 105/74 106/78  Pulse: (!) 101 94 91 82  Resp: 16 18 18    Temp: (!) 97.4 F (36.3 C) (!) 97.5 F (36.4 C) 98.1 F (36.7 C) (!) 97.5 F (36.4 C)  TempSrc: Oral Oral Oral Oral  SpO2: 97% 95% 100% 95%  Weight:    110.4 kg  Height:        Intake/Output Summary (Last 24 hours) at 11/19/2020 11/21/2020 Last data filed at 11/18/2020 2130 Gross per 24 hour  Intake 120 ml  Output --  Net 120 ml   Last 3 Weights 11/19/2020 11/17/2020  Weight (lbs) 243 lb 6.2 oz 250 lb  Weight (kg) 110.4 kg 113.399 kg      Telemetry    Atrial fibrillation, rate 70-100- Personally Reviewed  ECG   None new- Personally Reviewed  Physical Exam   GEN: Well nourished, well developed, in no acute distress  HEENT: normal  Neck: no JVD, carotid bruits, or masses Cardiac: Irregular; no murmurs, rubs, or gallops,no edema  Respiratory:  clear to auscultation bilaterally, normal work of breathing GI: soft, nontender, nondistended, + BS MS: no deformity or atrophy  Skin: warm and dry Neuro:  Strength and sensation are intact Psych: euthymic mood, full affect   Labs    High Sensitivity Troponin:  No results for input(s): TROPONINIHS in the last 720 hours.     Chemistry Recent Labs  Lab 11/17/20 0203 11/17/20 1523 11/18/20 0200 11/19/20 0229  NA 133* 133* 132* 133*  K 5.5* 4.9 4.8 4.4  CL 104 104 103 103  CO2 18* 20* 19* 19*  GLUCOSE 83 86 76 74  BUN 24* 25* 23 21  CREATININE 1.97* 1.97* 1.88* 1.64*  CALCIUM 9.4 9.1 8.9 8.9  PROT 6.1*  --  5.4*  --   ALBUMIN 2.7*  --  2.4*  --   AST 17  --  17  --   ALT 15  --  14  --   ALKPHOS 90  --  88  --   BILITOT 1.2  --  1.4*  --   GFRNONAA 27* 27* 28* 33*  ANIONGAP 11 9 10 11      Hematology Recent Labs  Lab 11/17/20 0203 11/18/20 0200 11/19/20 0229  WBC 5.6 5.5 5.3  RBC 3.15* 2.98* 3.01*  HGB 12.1 11.5* 11.5*  HCT 36.3 34.0* 33.1*  MCV 115.2* 114.1* 110.0*  MCH 38.4* 38.6* 38.2*  MCHC 33.3 33.8 34.7  RDW 20.5* 20.2* 20.1*  PLT 260 208 232    BNPNo results for input(s): BNP, PROBNP in the last 168 hours.   DDimer No results for input(s):  DDIMER in the last 168 hours.   Radiology    DG Chest 2 View Result Date: 11/16/2020 CLINICAL DATA:  Atrial fibrillation. EXAM: CHEST - 2 VIEW COMPARISON:  None. FINDINGS: Mild cardiomegaly. Normal mediastinal contours. No pulmonary edema. No focal airspace disease, large pleural effusion or pneumothorax. Multilevel degenerative change in the thoracic spine. Superior subluxation of the humeral head abutting the undersurface of the acromion on the right. No acute osseous abnormalities are seen. IMPRESSION: Mild cardiomegaly without acute pulmonary process. Electronically Signed   By: Narda Rutherford M.D.   On: 11/16/2020 17:56    Cardiac Studies   Echo still pending, ordered yesterday  Patient Profile     70 y.o. female w/PMHx of permanent AFib, HTN > relative hypotension, CKD (III), NICM is mentioned in her paperwork, OA, pre-DM.  Admitted from her cardiologist office 2/2 AFib w/ RVR   Assessment & Plan    1.  Permanent atrial fibrillation: CHA2DS2-VASc of 4.  Has been restarted on Eliquis.  She was initially on diltiazem, but  she was found to have a low ejection fraction.  She has been switched to Toprol-XL 50 mg twice daily.  Her heart rates are much better controlled on this medication.    2.  Nonischemic cardiomyopathy: Found on echo here today.  It does appear that this is a long-term diagnosis.  Continue with Toprol-XL.    At this point, the patient's heart rate is well controlled.  No further work-up per cardiology.  She should be discharged to follow-up with her primary cardiologist.  No need at this time for electrophysiology follow-up.  Discharge on both Eliquis and current dose of Toprol-XL.  Cardiology to sign off.  Call us back if there are any further questions.   For questions or updates, please contact [facility] Please consult [email or web address] for contact info under        Signed, Caylee Vlachos Jorja Loa, MD  11/19/2020, 8:35 AM

## 2020-11-19 NOTE — Discharge Summary (Signed)
Family Medicine [facility] Discharge Summary  Patient name: Kylie Collins Medical record number: [id number] Date of birth: [birth year] Age: 70 y.o. Gender: female Date of Admission: 11/16/2020  Date of Discharge: 11/19/2020 Admitting Physician: Reece Leader, DO  Primary Care Provider: Pcp, No Consultants: Cardiology  Indication for Hospitalization: Atrial fibrillation with RVR  Discharge Diagnoses/Problem List:  Permanent rate controlled atrial fibrillation Hypertension Dementia Depression Vitamin D deficiency  Disposition: Home with family  Discharge Condition: Stable  Discharge Exam:  Blood pressure (!) 116/92, pulse 82, temperature 97.6 F (36.4 C), temperature source Oral, resp. rate 18, height 5\' 7"  (1.702 m), weight 110.4 kg, SpO2 97 %. General: Awake, comfortable appearing, no acute distress, minimally interactive in conversation Cardiac: Irregularly irregular rhythm, rate in the 80s, no murmur appreciated, warm perfused extremities Respiratory: Clear to auscultation bilaterally  Brief Hospital Course:  Kylie Collins is a 70 y.o. female presenting to ED from cardiology office with atrial fibrillation with RVR. PMH is significant for atrial fibrillation, CKD, depression and vitamin D deficiency.   Patient has known history of atrial fibrillation, was seen at her regular outpatient cardiology office on 4/27 where she was found to be tachycardic and in RVR to 150-160 range.  Was instructed to come to the ED.  On presentation to the ED, she was found to be tachycardic in the 160s and tachypneic to the 30s but normotensive.  Patient insisted she was comfortable and denied chest pain, shortness of breath, edema.  SPO2 normal on room air.  Home meds at the time of admission included metoprolol tartrate 25 mg daily and Eliquis 5 mg twice daily.  Admission labs demonstrated normal TSH, creatinine 1.81, potassium 5.7, sodium 134.  Chest x-ray showed mild cardiomegaly  without acute pulmonary processes.  Patient was started on diltiazem drip in ED with improvement in rate.  Cardiology consulted, transitioned patient to p.o. diltiazem.  Echo demonstrated worsening function; EF 30 to 35%, moderately decreased LV function, LV global hypokinesis, severe dilation of left and right atria.  Given worsening heart function, stopped diltiazem in favor of metoprolol succinate 50 mg twice daily.  On day of discharge, heart rate 70-100 with patient comfortable and without complaints of chest pain, shortness of breath, edema.  Patient discharged on metoprolol succinate 50 mg twice daily and Eliquis 5 mg twice daily with plan for close cardiology follow-up.  Issues for Follow Up:  1. Follow-up with regular [facility] Cardiologist: Dr. 5/27 [facility]) 2. Cardiology and EP consulted this admission.  Determined this patient would not be a good candidate for either cardioversion or ablation given age, health, comorbidities.  Goal is medical management with rate control.  Of note, patient is asymptomatic even with tachycardia to 110s, 120s.  Likely does not need aggressive rate control given comfort. 3. Discharged on home Eliquis 4. Discharged on new medicine of Toprol-XL 50 mg twice daily.  With this medication, good rate control of 70-100 bpm.  Given that this patient is asymptomatic, would recommend letting heart rate run little higher to avoid hypotension. 5. Dementia.  Recommend outpatient neurology referral for ongoing dementia assessment and treatment. 6. Recommend goals of care conversation.  Patient with significant dementia, executive dysfunction, limited ADLs, relies on family for most of care.  Patient was listed as full code during this admission, although she would likely not fare well with CPR if initiated and would most assuredly not survive with meaningful quality of life.  Asked palliative care team to have outpatient palliative  care team reach  out to family for in-depth compassionate goals of care conversation.  Significant Procedures: None  Significant Labs and Imaging:  Recent Labs  Lab 11/17/20 0203 11/18/20 0200 11/19/20 0229  WBC 5.6 5.5 5.3  HGB 12.1 11.5* 11.5*  HCT 36.3 34.0* 33.1*  PLT 260 208 232   Recent Labs  Lab 11/16/20 1709 11/17/20 0203 11/17/20 1523 11/18/20 0200 11/19/20 0229  NA 134* 133* 133* 132* 133*  K 5.7* 5.5* 4.9 4.8 4.4  CL 105 104 104 103 103  CO2 19* 18* 20* 19* 19*  GLUCOSE 107* 83 86 76 74  BUN 24* 24* 25* 23 21  CREATININE 1.81* 1.97* 1.97* 1.88* 1.64*  CALCIUM 9.2 9.4 9.1 8.9 8.9  MG 2.0 1.9  --   --   --   PHOS  --  2.9  --   --   --   ALKPHOS  --  90  --  88  --   AST  --  17  --  17  --   ALT  --  15  --  14  --   ALBUMIN  --  2.7*  --  2.4*  --     CHEST - 2 VIEW 11/16/2020 COMPARISON:  None. FINDINGS: Mild cardiomegaly. Normal mediastinal contours. No pulmonary edema. No focal airspace disease, large pleural effusion or pneumothorax. Multilevel degenerative change in the thoracic spine. Superior subluxation of the humeral head abutting the undersurface of the acromion on the right. No acute osseous abnormalities are seen. IMPRESSION: Mild cardiomegaly without acute pulmonary process.  ECHO 11/18/2020 IMPRESSIONS  1. Left ventricular ejection fraction, by estimation, is 30 to 35%. The  left ventricle has moderately decreased function. The left ventricle  demonstrates global hypokinesis. The left ventricular internal cavity size  was mildly dilated. Left ventricular  diastolic parameters are indeterminate.  2. Right ventricular systolic function is normal. The right ventricular  size is normal. There is mildly elevated pulmonary artery systolic  pressure.  3. Left atrial size was severely dilated.  4. Right atrial size was severely dilated.  5. The pericardial effusion is circumferential.  6. The mitral valve is normal in structure. Mild mitral valve   regurgitation. No evidence of mitral stenosis.  7. Tricuspid valve regurgitation is mild to moderate.  8. The aortic valve is tricuspid. Aortic valve regurgitation is mild. No  aortic stenosis is present.  9. The inferior vena cava is normal in size with greater than 50%  respiratory variability, suggesting right atrial pressure of 3 mmHg.    Results/Tests Pending at Time of Discharge: None  Discharge Medications:  Allergies as of 11/19/2020   No Known Allergies     Medication List    STOP taking these medications   ciprofloxacin 500 MG tablet Commonly known as: CIPRO   metoprolol tartrate 25 MG tablet Commonly known as: LOPRESSOR     TAKE these medications   clotrimazole-betamethasone cream Commonly known as: LOTRISONE Apply 1 application topically 2 (two) times daily.   dronabinol 2.5 MG capsule Commonly known as: MARINOL Take 2.5 mg by mouth 2 (two) times daily with a meal.   DULoxetine 30 MG capsule Commonly known as: CYMBALTA Take 30 mg by mouth at bedtime.   Eliquis 5 MG Tabs tablet Generic drug: apixaban Take 5 mg by mouth 2 (two) times daily.   feeding supplement Liqd Take 237 mLs by mouth 2 (two) times daily between meals.   gabapentin 100 MG capsule Commonly known as: NEURONTIN  Take 100 mg by mouth at bedtime.   Magnesium 400 MG Tabs Take 400 mg by mouth daily.   metoprolol succinate 50 MG 24 hr tablet Commonly known as: TOPROL-XL Take 1 tablet (50 mg total) by mouth 2 (two) times daily. Take with or immediately following a meal.   potassium chloride 10 MEQ tablet Commonly known as: KLOR-CON Take 10 mEq by mouth 2 (two) times daily.   Vitamin D3 50 MCG (2000 UT) Tabs Take 2,000 Units by mouth daily.   zinc gluconate 50 MG tablet Take 50 mg by mouth daily.       Discharge Instructions: Please refer to Patient Instructions section of EMR for full details.  Patient was counseled important signs and symptoms that should prompt return to  medical care, changes in medications, dietary instructions, activity restrictions, and follow up appointments.   Follow-Up Appointments:  Follow-up Information    [facility] Follow up.   Specialty: Home Health Services Why: Registered Nurse, Physical Therapy, Occupational Therapy, Aide-office to call with a visit time.  Contact information: [address] [place] [address] [phone]        [facility] Follow up.   Specialty: PALLIATIVE CARE Why: [facility] for palliative services Contact information: [address] [facility] [place] [address] [phone]       Wille Glaser, MD. Schedule an appointment as soon as possible for a visit in 1 week(s).   Specialty: Cardiology Why: Make a follow up appointment with your cardiologist for 1-2 weeks after discharge.  Contact information: [address] [place] [address] [phone]               Fayette Pho, MD 11/19/2020, 5:20 PM PGY-1, [facility]

## 2020-11-19 NOTE — Plan of Care (Signed)

## 2020-11-19 NOTE — Hospital Course (Addendum)
Kylie Collins is a 70 y.o. female presenting to ED from cardiology office with atrial fibrillation with RVR. PMH is significant for atrial fibrillation, CKD, depression and vitamin D deficiency.   Patient has known history of atrial fibrillation, was seen at her regular outpatient cardiology office on 4/27 where she was found to be tachycardic and in RVR to 150-160 range.  Was instructed to come to the ED.  On presentation to the ED, she was found to be tachycardic in the 160s and tachypneic to the 30s but normotensive.  Patient insisted she was comfortable and denied chest pain, shortness of breath, edema.  SPO2 normal on room air.  Home meds at the time of admission included metoprolol tartrate 25 mg daily and Eliquis 5 mg twice daily.  Admission labs demonstrated normal TSH, creatinine 1.81, potassium 5.7, sodium 134.  Chest x-ray showed mild cardiomegaly without acute pulmonary processes.  Patient was started on diltiazem drip in ED with improvement in rate.  Cardiology consulted, transitioned patient to p.o. diltiazem.  Echo demonstrated worsening function; EF 30 to 35%, moderately decreased LV function, LV global hypokinesis, severe dilation of left and right atria.  Given worsening heart function, stopped diltiazem in favor of metoprolol succinate 50 mg twice daily.  On day of discharge, heart rate 70-100 with patient comfortable and without complaints of chest pain, shortness of breath, edema.  Patient discharged on metoprolol succinate 50 mg twice daily and Eliquis 5 mg twice daily with plan for close cardiology follow-up.

## 2021-01-20 DEATH — deceased

## 2022-02-27 IMAGING — CR DG CHEST 2V
2 series · 2 of 2 positions shown · non-contrast
Comparison: None.

CLINICAL DATA: Atrial fibrillation.

EXAM:
CHEST - 2 VIEW

[chest lat]
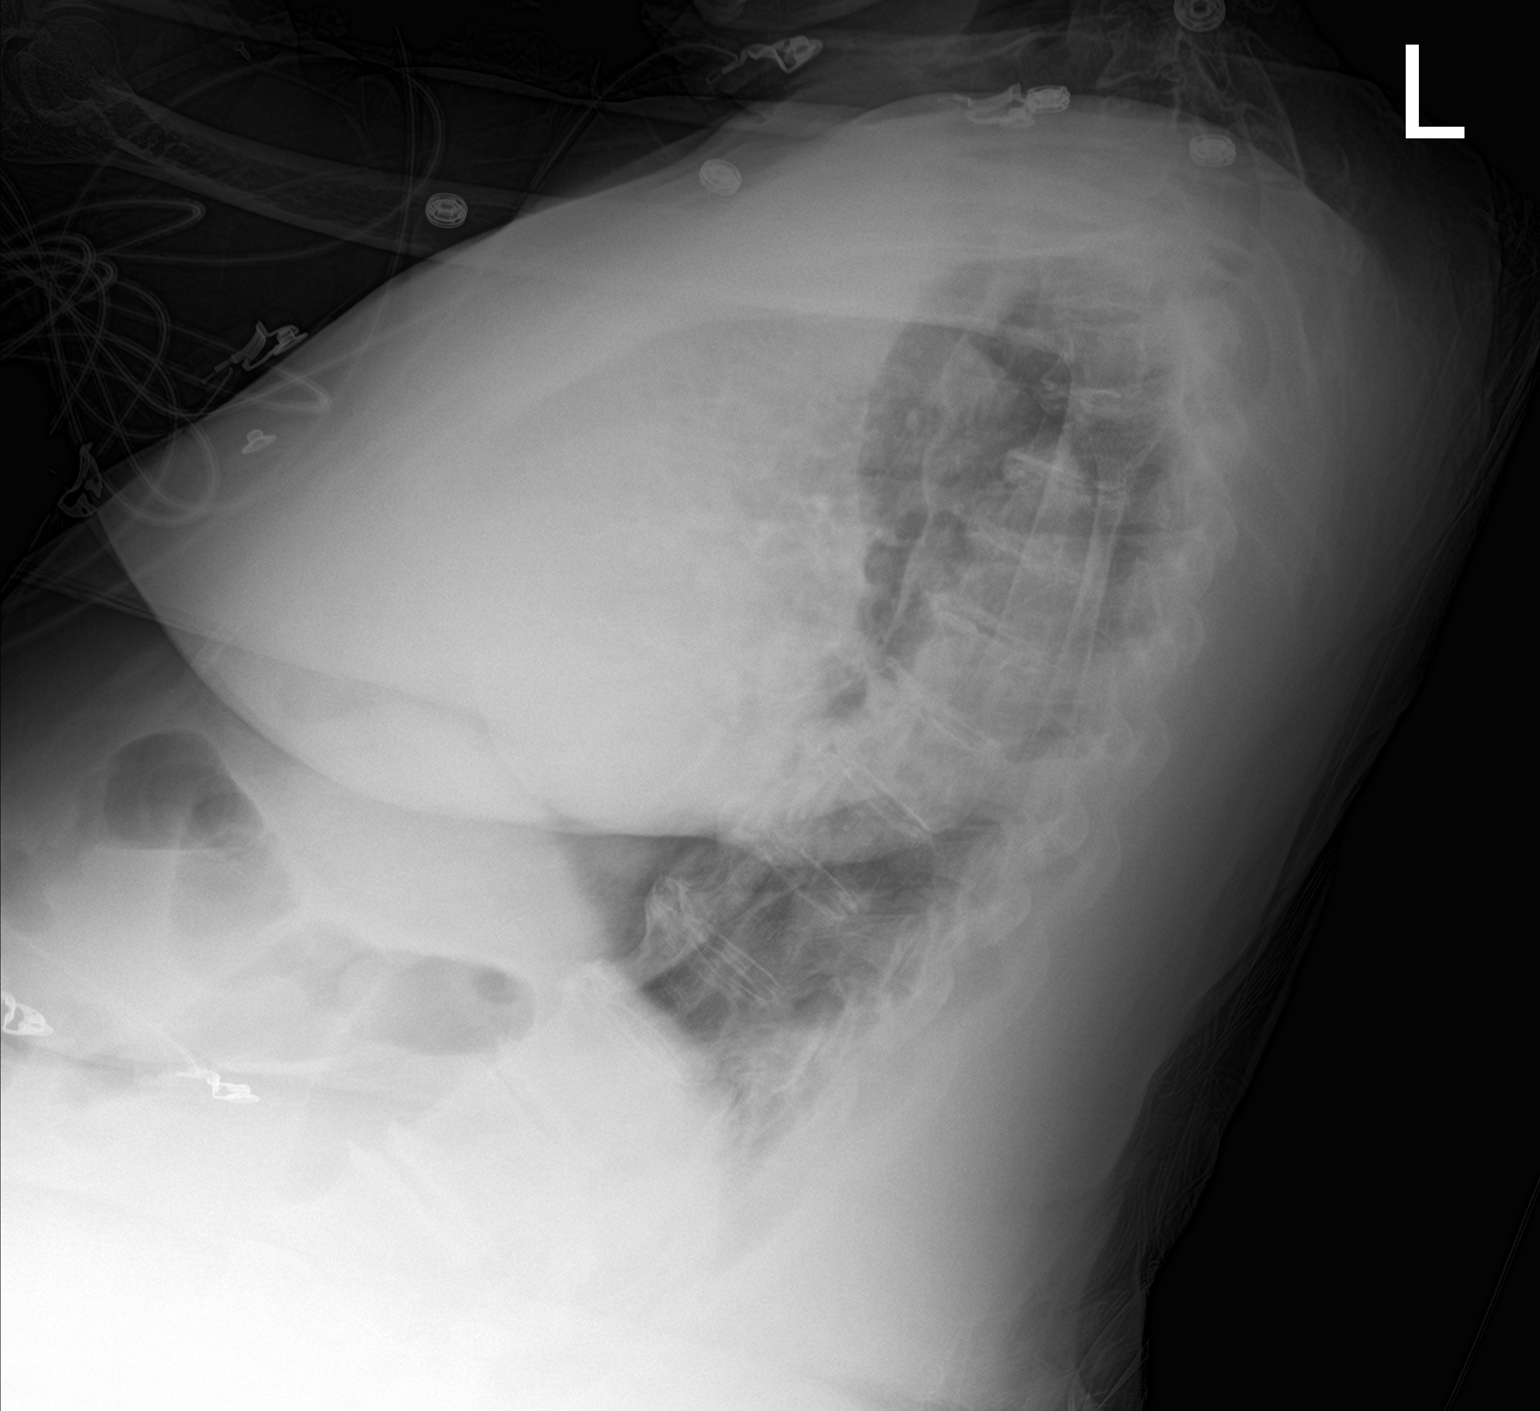

[chest ap]
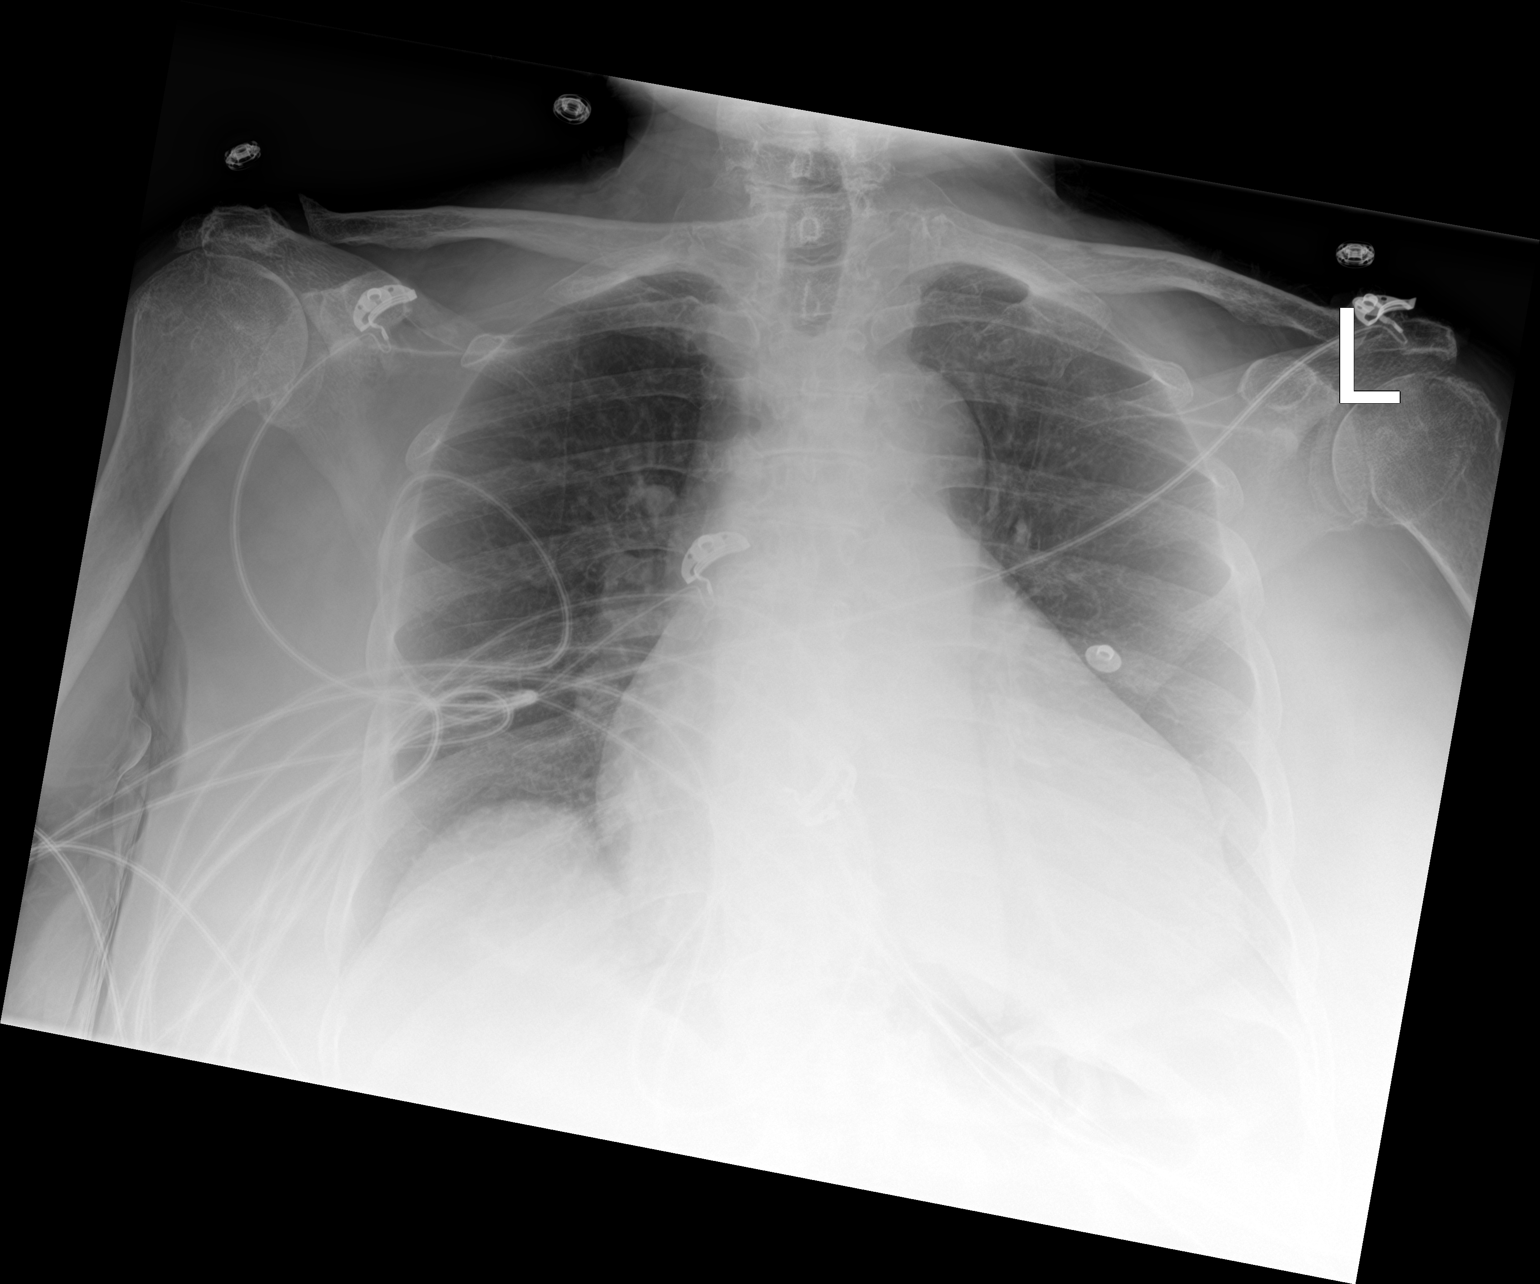

[2 of 2 positions shown; findings below may reference images not displayed]

FINDINGS: Mild cardiomegaly. Normal mediastinal contours. No pulmonary edema.
No focal airspace disease, large pleural effusion or pneumothorax.
Multilevel degenerative change in the thoracic spine. Superior
subluxation of the humeral head abutting the undersurface of the
acromion on the right. No acute osseous abnormalities are seen.
IMPRESSION: Mild cardiomegaly without acute pulmonary process.
